# Patient Record
Sex: Female | Born: 1970 | Race: Black or African American | Hispanic: No | Smoking: Former smoker
Health system: Southern US, Community
[De-identification: ages and names within clinical notes are randomized; demographics above are authoritative.]

## PROBLEM LIST (undated history)

## (undated) DIAGNOSIS — R7303 Prediabetes: Secondary | ICD-10-CM

## (undated) DIAGNOSIS — F32A Depression, unspecified: Secondary | ICD-10-CM

## (undated) DIAGNOSIS — F419 Anxiety disorder, unspecified: Secondary | ICD-10-CM

## (undated) DIAGNOSIS — M1990 Unspecified osteoarthritis, unspecified site: Secondary | ICD-10-CM

## (undated) DIAGNOSIS — G709 Myoneural disorder, unspecified: Secondary | ICD-10-CM

## (undated) DIAGNOSIS — K219 Gastro-esophageal reflux disease without esophagitis: Secondary | ICD-10-CM

## (undated) DIAGNOSIS — R51 Headache: Secondary | ICD-10-CM

## (undated) DIAGNOSIS — R519 Headache, unspecified: Secondary | ICD-10-CM

## (undated) HISTORY — DX: Gastro-esophageal reflux disease without esophagitis: K21.9

## (undated) HISTORY — DX: Myoneural disorder, unspecified: G70.9

## (undated) HISTORY — DX: Headache: R51

## (undated) HISTORY — DX: Headache, unspecified: R51.9

## (undated) HISTORY — PX: SHOULDER ARTHROSCOPY: SHX128

## (undated) HISTORY — PX: LAPAROSCOPIC GASTRIC SLEEVE RESECTION: SHX5895

---

## 1994-01-24 DIAGNOSIS — F4310 Post-traumatic stress disorder, unspecified: Secondary | ICD-10-CM

## 1994-01-24 HISTORY — DX: Post-traumatic stress disorder, unspecified: F43.10

## 2000-01-25 HISTORY — PX: HEMICOLECTOMY: SHX854

## 2000-01-25 HISTORY — PX: COLON RESECTION: SHX5231

## 2014-10-07 ENCOUNTER — Emergency Department (HOSPITAL_COMMUNITY)
Admission: EM | Admit: 2014-10-07 | Discharge: 2014-10-07 | Disposition: A | Discharge status: Home / Self Care | Attending: Emergency Medicine | Admitting: Emergency Medicine

## 2014-10-07 ENCOUNTER — Emergency Department (HOSPITAL_COMMUNITY)

## 2014-10-07 ENCOUNTER — Encounter (HOSPITAL_COMMUNITY): Payer: Self-pay | Admitting: Emergency Medicine

## 2014-10-07 DIAGNOSIS — S99911A Unspecified injury of right ankle, initial encounter: Secondary | ICD-10-CM | POA: Diagnosis present

## 2014-10-07 DIAGNOSIS — M62838 Other muscle spasm: Secondary | ICD-10-CM

## 2014-10-07 DIAGNOSIS — Y9389 Activity, other specified: Secondary | ICD-10-CM | POA: Insufficient documentation

## 2014-10-07 DIAGNOSIS — Y92410 Unspecified street and highway as the place of occurrence of the external cause: Secondary | ICD-10-CM | POA: Diagnosis not present

## 2014-10-07 DIAGNOSIS — Y998 Other external cause status: Secondary | ICD-10-CM | POA: Diagnosis not present

## 2014-10-07 DIAGNOSIS — S93401A Sprain of unspecified ligament of right ankle, initial encounter: Secondary | ICD-10-CM | POA: Diagnosis not present

## 2014-10-07 MED ORDER — NAPROXEN 500 MG PO TABS: 500.0000 mg | ORAL_TABLET | Freq: Two times a day (BID) | ORAL | Status: DC | PRN

## 2014-10-07 MED ORDER — HYDROCODONE-ACETAMINOPHEN 5-325 MG PO TABS
1.0000 | ORAL_TABLET | Freq: Once | ORAL | Status: AC
  Administered 2014-10-07: 1 via ORAL
  Filled 2014-10-07: qty 1

## 2014-10-07 MED ORDER — HYDROCODONE-ACETAMINOPHEN 5-325 MG PO TABS: 1.0000 | ORAL_TABLET | Freq: Four times a day (QID) | ORAL | Status: DC | PRN

## 2014-10-07 MED ORDER — CYCLOBENZAPRINE HCL 10 MG PO TABS: 10.0000 mg | ORAL_TABLET | Freq: Three times a day (TID) | ORAL | Status: DC | PRN
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
## 2014-10-07 NOTE — ED Notes (Signed)
Patient called out to advise that she can't use crutches because she has an old rotator cuff injury.  Advised PA.

## 2014-10-07 NOTE — Discharge Instructions (Signed)
Take naprosyn as directed for inflammation and pain with norco for breakthrough pain and flexeril for muscle relaxation. Do not drive or operate machinery with pain medication or muscle relaxation use. Ice to areas of soreness for the next 24 hours and then may move to heat, no more than 20 minutes at a time every hour for each. Expect to be sore for the next few days and follow up with primary care physician for recheck of ongoing symptoms in the next 1-2 weeks. Return to ER for emergent changing or worsening of symptoms.    Wear ankle brace for at least 2 weeks for stabilization of ankle. Use crutches as needed for comfort. Ice and elevate ankle throughout the day. Alternate between naprosyn and norco for pain relief. Do not drive or operate machinery with pain medication use. Call orthopedic follow up today or tomorrow to schedule followup appointment for recheck of ongoing ankle pain that can be canceled with a 24-48 hour notice if complete resolution of pain. Return to the ER for changes or worsening symptoms.    Ankle Sprain An ankle sprain is an injury to the strong, fibrous tissues (ligaments) that hold your ankle bones together.  HOME CARE   Put ice on your ankle for 1-2 days or as told by your doctor.  Put ice in a plastic bag.  Place a towel between your skin and the bag.  Leave the ice on for 15-20 minutes at a time, every 2 hours while you are awake.  Only take medicine as told by your doctor.  Raise (elevate) your injured ankle above the level of your heart as much as possible for 2-3 days.  Use crutches if your doctor tells you to. Slowly put your own weight on the affected ankle. Use the crutches until you can walk without pain.  If you have a plaster splint:  Do not rest it on anything harder than a pillow for 24 hours.  Do not put weight on it.  Do not get it wet.  Take it off to shower or bathe.  If given, use an elastic wrap or support stocking for support. Take  the wrap off if your toes lose feeling (numb), tingle, or turn cold or blue.  If you have an air splint:  Add or let out air to make it comfortable.  Take it off at night and to shower and bathe.  Wiggle your toes and move your ankle up and down often while you are wearing it. GET HELP IF:  You have rapidly increasing bruising or puffiness (swelling).  Your toes feel very cold.  You lose feeling in your foot.  Your medicine does not help your pain. GET HELP RIGHT AWAY IF:   Your toes lose feeling (numb) or turn blue.  You have severe pain that is increasing. MAKE SURE YOU:   Understand these instructions.  Will watch your condition.  Will get help right away if you are not doing well or get worse. Document Released: 06/29/2007 Document Revised: 05/27/2013 Document Reviewed: 07/25/2011 [facility] Patient Information 2015 [place], [place]. This information is not intended to replace advice given to you by your health care provider. Make sure you discuss any questions you have with your health care provider.  Cryotherapy Cryotherapy is when you put ice on your injury. Ice helps lessen pain and puffiness (swelling) after an injury. Ice works the best when you start using it in the first 24 to 48 hours after an injury. HOME CARE  Put a dry or damp towel between the ice pack and your skin.  You may press gently on the ice pack.  Leave the ice on for no more than 10 to 20 minutes at a time.  Check your skin after 5 minutes to make sure your skin is okay.  Rest at least 20 minutes between ice pack uses.  Stop using ice when your skin loses feeling (numbness).  Do not use ice on someone who cannot tell you when it hurts. This includes small children and people with memory problems (dementia). GET HELP RIGHT AWAY IF:  You have white spots on your skin.  Your skin turns blue or pale.  Your skin feels waxy or hard.  Your puffiness gets worse. MAKE SURE YOU:   Understand  these instructions.  Will watch your condition.  Will get help right away if you are not doing well or get worse. Document Released: 06/29/2007 Document Revised: 04/04/2011 Document Reviewed: 09/02/2010 [facility] Patient Information 2015 [place], [place]. This information is not intended to replace advice given to you by your health care provider. Make sure you discuss any questions you have with your health care provider.  Motor Vehicle Collision It is common to have multiple bruises and sore muscles after a motor vehicle collision (MVC). These tend to feel worse for the first 24 hours. You may have the most stiffness and soreness over the first several hours. You may also feel worse when you wake up the first morning after your collision. After this point, you will usually begin to improve with each day. The speed of improvement often depends on the severity of the collision, the number of injuries, and the location and nature of these injuries. HOME CARE INSTRUCTIONS  Put ice on the injured area.  Put ice in a plastic bag.  Place a towel between your skin and the bag.  Leave the ice on for 15-20 minutes, 3-4 times a day, or as directed by your health care provider.  Drink enough fluids to keep your urine clear or pale yellow. Do not drink alcohol.  Take a warm shower or bath once or twice a day. This will increase blood flow to sore muscles.  You may return to activities as directed by your caregiver. Be careful when lifting, as this may aggravate neck or back pain.  Only take over-the-counter or prescription medicines for pain, discomfort, or fever as directed by your caregiver. Do not use aspirin. This may increase bruising and bleeding. SEEK IMMEDIATE MEDICAL CARE IF:  You have numbness, tingling, or weakness in the arms or legs.  You develop severe headaches not relieved with medicine.  You have severe neck pain, especially tenderness in the middle of the back of your  neck.  You have changes in bowel or bladder control.  There is increasing pain in any area of the body.  You have shortness of breath, light-headedness, dizziness, or fainting.  You have chest pain.  You feel sick to your stomach (nauseous), throw up (vomit), or sweat.  You have increasing abdominal discomfort.  There is blood in your urine, stool, or vomit.  You have pain in your shoulder (shoulder strap areas).  You feel your symptoms are getting worse. MAKE SURE YOU:  Understand these instructions.  Will watch your condition.  Will get help right away if you are not doing well or get worse. Document Released: 01/10/2005 Document Revised: 05/27/2013 Document Reviewed: 06/09/2010 [facility] Patient Information 2015 [place], [place]. This information is not  intended to replace advice given to you by your health care provider. Make sure you discuss any questions you have with your health care provider.  Muscle Cramps and Spasms Muscle cramps and spasms occur when a muscle or muscles tighten and you have no control over this tightening (involuntary muscle contraction). They are a common problem and can develop in any muscle. The most common place is in the calf muscles of the leg. Both muscle cramps and muscle spasms are involuntary muscle contractions, but they also have differences:   Muscle cramps are sporadic and painful. They may last a few seconds to a quarter of an hour. Muscle cramps are often more forceful and last longer than muscle spasms.  Muscle spasms may or may not be painful. They may also last just a few seconds or much longer. CAUSES  It is uncommon for cramps or spasms to be due to a serious underlying problem. In many cases, the cause of cramps or spasms is unknown. Some common causes are:   Overexertion.   Overuse from repetitive motions (doing the same thing over and over).   Remaining in a certain position for a long period of time.   Improper  preparation, form, or technique while performing a sport or activity.   Dehydration.   Injury.   Side effects of some medicines.   Abnormally low levels of the salts and ions in your blood (electrolytes), especially potassium and calcium. This could happen if you are taking water pills (diuretics) or you are pregnant.  Some underlying medical problems can make it more likely to develop cramps or spasms. These include, but are not limited to:   Diabetes.   Parkinson disease.   Hormone disorders, such as thyroid problems.   Alcohol abuse.   Diseases specific to muscles, joints, and bones.   Blood vessel disease where not enough blood is getting to the muscles.  HOME CARE INSTRUCTIONS   Stay well hydrated. Drink enough water and fluids to keep your urine clear or pale yellow.  It may be helpful to massage, stretch, and relax the affected muscle.  For tight or tense muscles, use a warm towel, heating pad, or hot shower water directed to the affected area.  If you are sore or have pain after a cramp or spasm, applying ice to the affected area may relieve discomfort.  Put ice in a plastic bag.  Place a towel between your skin and the bag.  Leave the ice on for 15-20 minutes, 03-04 times a day.  Medicines used to treat a known cause of cramps or spasms may help reduce their frequency or severity. Only take over-the-counter or prescription medicines as directed by your caregiver. SEEK MEDICAL CARE IF:  Your cramps or spasms get more severe, more frequent, or do not improve over time.  MAKE SURE YOU:   Understand these instructions.  Will watch your condition.  Will get help right away if you are not doing well or get worse. Document Released: 07/02/2001 Document Revised: 05/07/2012 Document Reviewed: 12/28/2011 [facility] Patient Information 2015 [place], [place]. This information is not intended to replace advice given to you by your health care provider. Make sure  you discuss any questions you have with your health care provider.  Heat Therapy Heat therapy can help make painful, stiff muscles and joints feel better. Do not use heat on new injuries. Wait at least 48 hours after an injury to use heat. Do not use heat when you have aches or pains  right after an activity. If you still have pain 3 hours after stopping the activity, then you may use heat. HOME CARE Wet heat pack  Soak a clean towel in warm water. Squeeze out the extra water.  Put the warm, wet towel in a plastic bag.  Place a thin, dry towel between your skin and the bag.  Put the heat pack on the area for 5 minutes, and check your skin. Your skin may be pink, but it should not be red.  Leave the heat pack on the area for 15 to 30 minutes.  Repeat this every 2 to 4 hours while awake. Do not use heat while you are sleeping. Warm water bath  Fill a tub with warm water.  Place the affected body part in the tub.  Soak the area for 20 to 40 minutes.  Repeat as needed. Hot water bottle  Fill the water bottle half full with hot water.  Press out the extra air. Close the cap tightly.  Place a dry towel between your skin and the bottle.  Put the bottle on the area for 5 minutes, and check your skin. Your skin may be pink, but it should not be red.  Leave the bottle on the area for 15 to 30 minutes.  Repeat this every 2 to 4 hours while awake. Electric heating pad  Place a dry towel between your skin and the heating pad.  Set the heating pad on low heat.  Put the heating pad on the area for 10 minutes, and check your skin. Your skin may be pink, but it should not be red.  Leave the heating pad on the area for 20 to 40 minutes.  Repeat this every 2 to 4 hours while awake.  Do not lie on the heating pad.  Do not fall asleep while using the heating pad.  Do not use the heating pad near water. GET HELP RIGHT AWAY IF:  You get blisters or red skin.  Your skin is puffy  (swollen), or you lose feeling (numbness) in the affected area.  You have any new problems.  Your problems are getting worse.  You have any questions or concerns. If you have any problems, stop using heat therapy until you see your doctor. MAKE SURE YOU:  Understand these instructions.  Will watch your condition.  Will get help right away if you are not doing well or get worse. Document Released: 04/04/2011 Document Reviewed: 03/05/2013 [facility] Patient Information 2015 [place], [place]. This information is not intended to replace advice given to you by your health care provider. Make sure you discuss any questions you have with your health care provider.
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
## 2014-10-14 ENCOUNTER — Emergency Department (HOSPITAL_COMMUNITY)
Admission: EM | Admit: 2014-10-14 | Discharge: 2014-10-14 | Disposition: A | Discharge status: Home / Self Care | Attending: Emergency Medicine | Admitting: Emergency Medicine

## 2014-10-14 ENCOUNTER — Encounter (HOSPITAL_COMMUNITY): Payer: Self-pay | Admitting: Emergency Medicine

## 2014-10-14 DIAGNOSIS — R51 Headache: Secondary | ICD-10-CM | POA: Insufficient documentation

## 2014-10-14 DIAGNOSIS — R519 Headache, unspecified: Secondary | ICD-10-CM

## 2014-10-14 MED ORDER — KETOROLAC TROMETHAMINE 30 MG/ML IJ SOLN
30.0000 mg | Freq: Once | INTRAMUSCULAR | Status: AC
  Administered 2014-10-14: 30 mg via INTRAMUSCULAR
  Filled 2014-10-14: qty 1

## 2014-10-14 MED ORDER — DIPHENHYDRAMINE HCL 25 MG PO CAPS
25.0000 mg | ORAL_CAPSULE | Freq: Once | ORAL | Status: AC
  Administered 2014-10-14: 25 mg via ORAL
  Filled 2014-10-14: qty 1

## 2014-10-14 MED ORDER — METOCLOPRAMIDE HCL 10 MG PO TABS
5.0000 mg | ORAL_TABLET | Freq: Once | ORAL | Status: AC
  Administered 2014-10-14: 5 mg via ORAL
  Filled 2014-10-14: qty 1

## 2014-10-14 NOTE — Discharge Instructions (Signed)
Please read/information. Please follow-up with [facility] wellness for further evaluation and management.

## 2014-10-14 NOTE — ED Notes (Signed)
Pt offered tylenol for pain and refused

## 2014-10-14 NOTE — ED Provider Notes (Signed)
CSN: [id number]     Arrival date & time 10/14/14  1113 History   First MD Initiated Contact with Patient 10/14/14 1341     Chief Complaint  Patient presents with  . Headache    HPI   44 year old female with a history of migraines presents today with a migraine. Patient reports last night slow onset of frontal throbbing headache, that is persisted through today. Patient reports the headache does not radiate, denies any changes and smells vision taste or any other neurological deficits; reports photophobia. Patient denies any fever, head trauma, neck stiffness. Patient reports this is identical to previous episodes, states that she recently moved to the area and has not found a primary care provider to prescribe her the sumatriptan she's used previously. Patient denies fever, chills, nausea vomiting, chest pain, abdominal pain. Patient has no other complaints in addition to her migraine.   History reviewed. No pertinent past medical history. History reviewed. No pertinent past surgical history. History reviewed. No pertinent family history. Social History  Substance Use Topics  . Smoking status: None  . Smokeless tobacco: None  . Alcohol Use: No   OB History    No data available     Review of Systems  All other systems reviewed and are negative.     Allergies  Review of patient's allergies indicates no known allergies.  Home Medications   Prior to Admission medications   Medication Sig Start Date End Date Taking? Authorizing Provider  cyclobenzaprine (FLEXERIL) 10 MG tablet Take 1 tablet (10 mg total) by mouth 3 (three) times daily as needed for muscle spasms. 10/07/14   Mercedes Camprubi-Soms, PA-C  HYDROcodone-acetaminophen (NORCO) 5-325 MG per tablet Take 1 tablet by mouth every 6 (six) hours as needed for severe pain. 10/07/14   Mercedes Camprubi-Soms, PA-C  naproxen (NAPROSYN) 500 MG tablet Take 1 tablet (500 mg total) by mouth 2 (two) times daily as needed for mild pain,  moderate pain or headache (TAKE WITH MEALS.). 10/07/14   Mercedes Camprubi-Soms, PA-C   BP 124/100 mmHg  Pulse 87  Temp(Src) 98.2 F (36.8 C) (Oral)  Resp 18  SpO2 97%  LMP 10/05/2014   Physical Exam  Constitutional: She is oriented to person, place, and time. She appears well-developed and well-nourished.  HENT:  Head: Normocephalic and atraumatic.  Eyes: Conjunctivae are normal. Pupils are equal, round, and reactive to light. Right eye exhibits no discharge. Left eye exhibits no discharge. No scleral icterus.  Neck: Normal range of motion. No JVD present. No tracheal deviation present.  Pulmonary/Chest: Effort normal. No stridor.  Neurological: She is alert and oriented to person, place, and time. She has normal strength. No cranial nerve deficit or sensory deficit. She displays a negative Romberg sign. Coordination normal. GCS eye subscore is 4. GCS verbal subscore is 5. GCS motor subscore is 6.  Psychiatric: She has a normal mood and affect. Her behavior is normal. Judgment and thought content normal.  Nursing note and vitals reviewed.   ED Course  Procedures (including critical care time) Labs Review Labs Reviewed - No data to display  Imaging Review No results found. I have personally reviewed and evaluated these images and lab results as part of my medical decision-making.   EKG Interpretation None      MDM   Final diagnoses:  Headache, unspecified headache type    Labs:  Imaging:  Consults:  Therapeutics: Benadryl, Reglan, Toradol  Discharge Meds:   Assessment/Plan: Patient's presentation most likely represents migraine headache. She  has no red flags, similar to previous. She was treated here in the ED with good symptom resolution, she is encouraged follow-up with [facility] and wellness for further evaluation and management. Patient given strict return precautions, verbalized understanding and agreement today's plan.         Eyvonne Mechanic,  PA-C 10/14/14 1508  Derwood Kaplan, MD 10/14/14 [phone]

## 2014-10-14 NOTE — ED Notes (Signed)
Pt sts right sided HA that started last night; pt denies visual change or nausea

## 2017-08-09 ENCOUNTER — Encounter: Payer: Self-pay | Admitting: Neurology

## 2017-10-17 NOTE — Progress Notes (Signed)
NEUROLOGY CONSULTATION NOTE  Meghan Orr MRN: [id number] DOB: [birth year]  Referring provider: Daisy FloroAntoinette Wymer, MD Primary care provider: Daisy FloroAntoinette Wymer, MD  Reason for consult:  migraines  HISTORY OF PRESENT ILLNESS: Meghan Orr is a 47 year old right-handed female who presents for headache.  History supplemented by referring provider's note.  Onset:  Early-mid 30s Location:  Top/crown of head Quality:  Starts with stabbing needles/sparks and then throbbing/pressure Intensity:  Severe.  She denies new headache, thunderclap headache or severe headache that wakes her from sleep. Aura:  Kaleidoscope vision Prodrome:  No Postdrome:  Hangover headache Associated symptoms:  Phonophobia, craves food, irritable.  She denies associated nausea, vomiting, photophobia, osmophobia, visual disturbance or unilateral numbness or weakness. Duration:  Sparks pain for 2 days, then headache gradually forms lasting 2 to 3 days, then hangover headache 2-3 days Frequency:  Every other month.  May be related to her menstrual cycle Triggers:  No Exacerbating factors:  Loud noise Relieving factors:  sleep Activity:  aggravates  Current NSAIDS:  Naproxen 500mg  (for shoulder pain) Current analgesics:  Norco (for shoulder pain) Current triptans:  Eletriptan 40mg  (feels like she has an "out of body" experience. She takes it at earliest onset of the pressure/throbbing headache) Current ergotamine:  no Current anti-emetic:  no Current muscle relaxants:  Flexeril 10mg  Current anti-anxiolytic:  no Current sleep aide:  trazodone Current Antihypertensive medications:  no Current Antidepressant medications:  Sertraline 100mg  daily Current Anticonvulsant medications:  topiramate 200mg  every night Current anti-CGRP:  no Current Vitamins/Herbal/Supplements:  no Current Antihistamines/Decongestants:  no Other therapy:  no Hormone/birth control:  Mirena  Past NSAIDS:  Ibuprofen, naproxen Past  analgesics:  Fioricet, Excedrin, Tylenol Past abortive triptans:  Maxalt Past abortive ergotamine:  no Past muscle relaxants:  no Past anti-emetic:  no Past antihypertensive medications:  no Past antidepressant medications:  Sertraline, Wellbutrin Past anticonvulsant medications:  gabapentin Past anti-CGRP:  no Past vitamins/Herbal/Supplements:  no Past antihistamines/decongestants:  no Other past therapies:  none  Caffeine:  16 oz coffee daily, Cherry Coke Zero 3 times week Alcohol:  yes Smoker:  no Diet:  Drinks 66 oz water Exercise:  walks Depression:  yes; Anxiety:  Yes.  PTSD Other pain:  Shoulder pain Sleep hygiene:  4 to 6 hours a night  08/01/17 LABS:  Na 138, K 3.8, CO2 26, glucose 104, Cr 0.748, t bili 0.3, ALT 24, AST 18. Family history of headache:  No  PAST MEDICAL HISTORY: PTSD  PAST SURGICAL HISTORY: Right shoulder surgery  MEDICATIONS: Current Outpatient Medications on File Prior to Visit  Medication Sig Dispense Refill  . cyclobenzaprine (FLEXERIL) 10 MG tablet Take 1 tablet (10 mg total) by mouth 3 (three) times daily as needed for muscle spasms. 15 tablet 0  . HYDROcodone-acetaminophen (NORCO) 5-325 MG per tablet Take 1 tablet by mouth every 6 (six) hours as needed for severe pain. 6 tablet 0  . naproxen (NAPROSYN) 500 MG tablet Take 1 tablet (500 mg total) by mouth 2 (two) times daily as needed for mild pain, moderate pain or headache (TAKE WITH MEALS.). 20 tablet 0   No current facility-administered medications on file prior to visit.     ALLERGIES: No Known Allergies  FAMILY HISTORY: No family history on file.  SOCIAL HISTORY: Social History   Socioeconomic History  . Marital status: Divorced    Spouse name: Not on file  . Number of children: Not on file  . Years of education: Not on file  . Highest  education level: Not on file  Occupational History  . Not on file  Social Needs  . Financial resource strain: Not on file  . Food  insecurity:    Worry: Not on file    Inability: Not on file  . Transportation needs:    Medical: Not on file    Non-medical: Not on file  Tobacco Use  . Smoking status: Not on file  Substance and Sexual Activity  . Alcohol use: No  . Drug use: No  . Sexual activity: Not on file  Lifestyle  . Physical activity:    Days per week: Not on file    Minutes per session: Not on file  . Stress: Not on file  Relationships  . Social connections:    Talks on phone: Not on file    Gets together: Not on file    Attends religious service: Not on file    Active member of club or organization: Not on file    Attends meetings of clubs or organizations: Not on file    Relationship status: Not on file  . Intimate partner violence:    Fear of current or ex partner: Not on file    Emotionally abused: Not on file    Physically abused: Not on file    Forced sexual activity: Not on file  Other Topics Concern  . Not on file  Social History Narrative  . Not on file    REVIEW OF SYSTEMS: Constitutional: No fevers, chills, or sweats, no generalized fatigue, change in appetite Eyes: No visual changes, double vision, eye pain Ear, nose and throat: No hearing loss, ear pain, nasal congestion, sore throat Cardiovascular: No chest pain, palpitations Respiratory:  No shortness of breath at rest or with exertion, wheezes GastrointestinaI: No nausea, vomiting, diarrhea, abdominal pain, fecal incontinence Genitourinary:  No dysuria, urinary retention or frequency Musculoskeletal:  No neck pain, back pain Integumentary: No rash, pruritus, skin lesions Neurological: as above Psychiatric: No depression, insomnia, anxiety Endocrine: No palpitations, fatigue, diaphoresis, mood swings, change in appetite, change in weight, increased thirst Hematologic/Lymphatic:  No purpura, petechiae. Allergic/Immunologic: no itchy/runny eyes, nasal congestion, recent allergic reactions, rashes  PHYSICAL EXAM: Blood  pressure 130/86, pulse 72, height 5\' 8"  (1.727 m), weight (!) 330 lb (149.7 kg), SpO2 99 %. General: No acute distress.  Patient appears well-groomed.  Head:  Normocephalic/atraumatic Eyes:  fundi examined but not visualized Neck: supple, no paraspinal tenderness, full range of motion Back: No paraspinal tenderness Heart: regular rate and rhythm Lungs: Clear to auscultation bilaterally. Vascular: No carotid bruits. Neurological Exam: Mental status: alert and oriented to person, place, and time, recent and remote memory intact, fund of knowledge intact, attention and concentration intact, speech fluent and not dysarthric, language intact. Cranial nerves: CN I: not tested CN II: pupils equal, round and reactive to light, visual fields intact CN III, IV, VI:  full range of motion, no nystagmus, no ptosis CN V: facial sensation intact CN VII: upper and lower face symmetric CN VIII: hearing intact CN IX, X: gag intact, uvula midline CN XI: sternocleidomastoid and trapezius muscles intact CN XII: tongue midline Bulk & Tone: normal, no fasciculations. Motor:  5/5 throughout  Sensation: temperature and vibration sensation intact. Deep Tendon Reflexes:  2+ throughout, toes downgoing.  Finger to nose testing:  Without dysmetria.  Heel to shin:  Without dysmetria.  Gait:  Normal station and stride.  Able to turn and tandem walk. Romberg negative.  IMPRESSION: Migraine with aura, without status  migrainosus, intractable Morbid obesity (BMI 50.18)  PLAN: 1.  Will start Aimovig 70mg  monthly in addition to topiramate 200mg  at night. 2.  For abortive therapy, will try Zomig 5mg  NS 3.  Limit use of pain relievers to no more than 2 days out of week to prevent risk of rebound or medication-overuse headache. 4.  Keep headache diary 5.  Exercise, hydration, sleep hygiene, weight loss 6.  Consider magnesium citrate 400mg  daily, riboflavin 400mg  daily and CoQ10 100mg  three times daily 7.  Follow up  in 3 to 4 months.  Thank you for allowing me to take part in the care of this patient.  Shon Millet, DO  CC:  Daisy Floro, MD

## 2017-10-18 ENCOUNTER — Encounter

## 2017-10-18 ENCOUNTER — Ambulatory Visit (INDEPENDENT_AMBULATORY_CARE_PROVIDER_SITE_OTHER): Payer: No Typology Code available for payment source | Admitting: Neurology

## 2017-10-18 ENCOUNTER — Encounter: Payer: Self-pay | Admitting: Neurology

## 2017-10-18 VITALS — BP 130/86 | HR 72 | Ht 68.0 in | Wt 330.0 lb

## 2017-10-18 DIAGNOSIS — G43111 Migraine with aura, intractable, with status migrainosus: Secondary | ICD-10-CM

## 2017-10-18 MED ORDER — ERENUMAB-AOOE 70 MG/ML ~~LOC~~ SOAJ: 70.0000 mg | SUBCUTANEOUS | 11 refills | Status: DC

## 2017-10-18 MED ORDER — ZOLMITRIPTAN 5 MG NA SOLN: 5.0000 mg | NASAL | 0 refills | Status: DC | PRN

## 2017-10-18 MED ORDER — ERENUMAB-AOOE 70 MG/ML ~~LOC~~ SOAJ: 70.0000 mg | SUBCUTANEOUS | 0 refills | Status: DC

## 2017-10-18 NOTE — Patient Instructions (Signed)
Migraine Recommendations: 1.  Start Aimovig 70mg  monthly injection.   2.  Take Zomig 5mg  nasal spray, 1 spray in one nostril at earliest onset of headache.  May repeat dose once in 2 hours if needed.  Do not exceed two doses in 24 hours. 3.  Limit use of pain relievers to no more than 2 days out of the week.  These medications include acetaminophen, ibuprofen, triptans and narcotics.  This will help reduce risk of rebound headaches. 4.  Be aware of common food triggers such as processed sweets, processed foods with nitrites (such as deli meat, hot dogs, sausages), foods with MSG, alcohol (such as wine), chocolate, certain cheeses, certain fruits (dried fruits, bananas, some citrus fruit), vinegar, diet soda. 4.  Avoid caffeine 5.  Routine exercise 6.  Proper sleep hygiene 7.  Stay adequately hydrated with water 8.  Keep a headache diary. 9.  Maintain proper stress management. 10.  Do not skip meals. 11.  Consider supplements:  Magnesium citrate 400mg  to 600mg  daily, riboflavin 400mg , Coenzyme Q 10 100mg  three times daily

## 2017-12-26 ENCOUNTER — Telehealth: Payer: Self-pay | Admitting: Neurology

## 2017-12-26 NOTE — Telephone Encounter (Signed)
[facility] [place] [facility] copy of the office visit notes from her September appt. Please fax this info to [phone]. Thanks!

## 2017-12-26 NOTE — Telephone Encounter (Signed)
Faxing notes.  

## 2018-02-20 ENCOUNTER — Telehealth: Payer: Self-pay | Admitting: Neurology

## 2018-02-20 NOTE — Progress Notes (Deleted)
NEUROLOGY FOLLOW UP OFFICE NOTE  Meghan Orr [id number]  HISTORY OF PRESENT ILLNESS: Meghan Orr is a 48 year old right-handed female who follows up for migraine.  UPDATE: Intensity:  *** Duration:  *** Frequency:  *** Frequency of abortive medication: *** Current NSAIDS:  Naproxen 500mg  (for shoulder pain) Current analgesics:   Zomig 5 mg nasal spray headache) Current ergotamine:  no Current anti-emetic:  no Current muscle relaxants:  Flexeril 10mg  Current anti-anxiolytic:  no Current sleep aide:  trazodone Current Antihypertensive medications:  no Current Antidepressant medications:  Sertraline 100mg  daily Current Anticonvulsant medications:  topiramate 200mg  every night Current anti-CGRP:   Aimovig 70 mg Current Vitamins/Herbal/Supplements:  no Current Antihistamines/Decongestants:  no Other therapy:  no Hormone/birth control:  Mirena  Caffeine:  16 oz coffee daily, Cherry Coke Zero 3 times week Alcohol:  yes Smoker:  no Diet:  Drinks 66 oz water Exercise:  walks Depression:  yes; Anxiety:  Yes.  PTSD Other pain:  Shoulder pain Sleep hygiene:  4 to 6 hours a night  HISTORY: Onset: Early to mid 30s Location:  Top/crown of head Quality:  Starts with stabbing needles/sparks and then throbbing/pressure Initial intensity:  Severe.  She denies new headache, thunderclap headache or severe headache that wakes her from sleep. Aura:  Kaleidoscope vision Prodrome:  No Postdrome:  Hangover headache Associated symptoms: Photophobia, craves food, irritable.  She denies associated nausea, vomiting, photophobia, osmophobia, visual disturbance or unilateral numbness or weakness. Initial duration:  Sparks pain for 2 days, then headache gradually forms lasting 2 to 3 days, then hangover headache 2-3 days Initial Frequency:  Every other month.  May be related to her menstrual cycle Triggers: None Exacerbating factors:  Loud noise Relieving factors:  sleep Activity:   aggravates  Past NSAIDS:  Ibuprofen, naproxen Past analgesics:  Fioricet, Excedrin, Tylenol Past abortive triptans:  Maxalt, Eletriptan (adverse reaction "out of body" experience) Past abortive ergotamine:  no Past muscle relaxants:  no Past anti-emetic:  no Past antihypertensive medications:  no Past antidepressant medications:  Sertraline, Wellbutrin Past anticonvulsant medications:  gabapentin Past anti-CGRP:  no Past vitamins/Herbal/Supplements:  no Past antihistamines/decongestants:  no Other past therapies:  none  Sleep hygiene:  4 to 6 hours a night  PAST MEDICAL HISTORY: Past Medical History:  Diagnosis Date  . Headache     MEDICATIONS: Current Outpatient Medications on File Prior to Visit  Medication Sig Dispense Refill  . cyclobenzaprine (FLEXERIL) 10 MG tablet Take 1 tablet (10 mg total) by mouth 3 (three) times daily as needed for muscle spasms. 15 tablet 0  . Erenumab-aooe (AIMOVIG) 70 MG/ML SOAJ Inject 70 mg into the skin every 30 (thirty) days. 1 pen 11  . Erenumab-aooe (AIMOVIG) 70 MG/ML SOAJ Inject 70 mg into the skin every 30 (thirty) days. 1 pen 0  . HYDROcodone-acetaminophen (NORCO) 5-325 MG per tablet Take 1 tablet by mouth every 6 (six) hours as needed for severe pain. 6 tablet 0  . naproxen (NAPROSYN) 500 MG tablet Take 1 tablet (500 mg total) by mouth 2 (two) times daily as needed for mild pain, moderate pain or headache (TAKE WITH MEALS.). 20 tablet 0  . zolmitriptan (ZOMIG) 5 MG nasal solution Place 1 spray into the nose as needed for migraine. 6 Units 0   No current facility-administered medications on file prior to visit.     ALLERGIES: Allergies  Allergen Reactions  . Eggs Or Egg-Derived Products   . Morphine     FAMILY HISTORY: Family History  Problem Relation Age of Onset  . Diabetes Father   . Heart attack Father   . Meniere's disease Sister    SOCIAL HISTORY: Social History   Socioeconomic History  . Marital status: Divorced     Spouse name: Not on file  . Number of children: 2  . Years of education: Not on file  . Highest education level: Not on file  Occupational History  . Occupation: Chartered certified accountantoptomitrist tech  Social Needs  . Financial resource strain: Not on file  . Food insecurity:    Worry: Not on file    Inability: Not on file  . Transportation needs:    Medical: Not on file    Non-medical: Not on file  Tobacco Use  . Smoking status: Former Smoker    Types: Cigarettes    Last attempt to quit: 01/25/2015    Years since quitting: 3.0  . Smokeless tobacco: Never Used  Substance and Sexual Activity  . Alcohol use: Yes    Comment: 2-3 cocktails a week  . Drug use: No  . Sexual activity: Not on file  Lifestyle  . Physical activity:    Days per week: Not on file    Minutes per session: Not on file  . Stress: Not on file  Relationships  . Social connections:    Talks on phone: Not on file    Gets together: Not on file    Attends religious service: Not on file    Active member of club or organization: Not on file    Attends meetings of clubs or organizations: Not on file    Relationship status: Not on file  . Intimate partner violence:    Fear of current or ex partner: Not on file    Emotionally abused: Not on file    Physically abused: Not on file    Forced sexual activity: Not on file  Other Topics Concern  . Not on file  Social History Narrative   Patient is right-handed. She is divorced and lives in a 2 story house with her son. She drinks 2 cups of coffee a day and a soda QOD. She is active at work.    REVIEW OF SYSTEMS: Constitutional: No fevers, chills, or sweats, no generalized fatigue, change in appetite Eyes: No visual changes, double vision, eye pain Ear, nose and throat: No hearing loss, ear pain, nasal congestion, sore throat Cardiovascular: No chest pain, palpitations Respiratory:  No shortness of breath at rest or with exertion, wheezes GastrointestinaI: No nausea, vomiting,  diarrhea, abdominal pain, fecal incontinence Genitourinary:  No dysuria, urinary retention or frequency Musculoskeletal:  No neck pain, back pain Integumentary: No rash, pruritus, skin lesions Neurological: as above Psychiatric: No depression, insomnia, anxiety Endocrine: No palpitations, fatigue, diaphoresis, mood swings, change in appetite, change in weight, increased thirst Hematologic/Lymphatic:  No purpura, petechiae. Allergic/Immunologic: no itchy/runny eyes, nasal congestion, recent allergic reactions, rashes  PHYSICAL EXAM: *** General: No acute distress.  Patient appears ***-groomed.  *** body habitus. Head:  Normocephalic/atraumatic Eyes:  Fundi examined but not visualized Neck: supple, no paraspinal tenderness, full range of motion Heart:  Regular rate and rhythm Lungs:  Clear to auscultation bilaterally Back: No paraspinal tenderness Neurological Exam: alert and oriented to person, place, and time. Attention span and concentration intact, recent and remote memory intact, fund of knowledge intact.  Speech fluent and not dysarthric, language intact.  CN II-XII intact. Bulk and tone normal, muscle strength 5/5 throughout.  Sensation to light touch  intact.  Deep tendon reflexes 2+ throughout.  Finger to nose testing intact.  Gait normal, Romberg negative.  IMPRESSION: Migraine without aura, without status migrainosus, not intractable  PLAN: 1.  For preventative management, *** 2.  For abortive therapy, *** 3.  Limit use of pain relievers to no more than 2 days out of week to prevent risk of rebound or medication-overuse headache. 4.  Keep headache diary 5.  Exercise, hydration, caffeine cessation, sleep hygiene, monitor for and avoid triggers 6.  Consider:  magnesium citrate 400mg  daily, riboflavin 400mg  daily, and coenzyme Q10 100mg  three times daily 7.  Follow up ***  Shon Millet, DO  CC: Daisy Floro, MD

## 2018-02-20 NOTE — Telephone Encounter (Signed)
[facility] Aimovig. She is also needing records?. Please call her at [phone]. Thanks!

## 2018-02-21 ENCOUNTER — Ambulatory Visit: Payer: Non-veteran care | Admitting: Neurology

## 2018-03-07 NOTE — Progress Notes (Deleted)
NEUROLOGY FOLLOW UP OFFICE NOTE  Meghan Orr [id number]  HISTORY OF PRESENT ILLNESS: Meghan Orr is a 48 year old right-handed female who follows up for migraine.  UPDATE: Intensity:  *** Duration:  *** Frequency:  *** Frequency of abortive medication: *** Current NSAIDS:  Naproxen 500mg  (for shoulder pain) Current analgesics:   Zomig 5 mg nasal spray  Current ergotamine:  no Current anti-emetic:  no Current muscle relaxants:  Flexeril 10mg  Current anti-anxiolytic:  no Current sleep aide:  trazodone Current Antihypertensive medications:  no Current Antidepressant medications:  Sertraline 100mg  daily Current Anticonvulsant medications:  topiramate 200mg  every night Current anti-CGRP:   Aimovig 70 mg Current Vitamins/Herbal/Supplements:  no Current Antihistamines/Decongestants:  no Other therapy:  no Hormone/birth control:  Mirena  Caffeine:  16 oz coffee daily, Cherry Coke Zero 3 times week Alcohol:  yes Smoker:  no Diet:  Drinks 66 oz water Exercise:  walks Depression:  yes; Anxiety:  Yes.  PTSD Other pain:  Shoulder pain Sleep hygiene:  4 to 6 hours a night  HISTORY: Onset: Early to mid 30s Location:  Top/crown of head Quality:  Starts with stabbing needles/sparks and then throbbing/pressure Initial intensity:  Severe.  She denies new headache, thunderclap headache or severe headache that wakes her from sleep. Aura:  Kaleidoscope vision Prodrome:  No Postdrome:  Hangover headache Associated symptoms: Photophobia, craves food, irritable.  She denies associated nausea, vomiting, photophobia, osmophobia, visual disturbance or unilateral numbness or weakness. Initial duration:  Sparks pain for 2 days, then headache gradually forms lasting 2 to 3 days, then hangover headache 2-3 days Initial Frequency:  Every other month.  May be related to her menstrual cycle Triggers: None Exacerbating factors:  Loud noise Relieving factors:  sleep Activity:   aggravates  Past NSAIDS:  Ibuprofen, naproxen Past analgesics:  Fioricet, Excedrin, Tylenol Past abortive triptans:  Maxalt, Eletriptan (adverse reaction "out of body" experience) Past abortive ergotamine:  no Past muscle relaxants:  no Past anti-emetic:  no Past antihypertensive medications:  no Past antidepressant medications:  Sertraline, Wellbutrin Past anticonvulsant medications:  gabapentin Past anti-CGRP:  no Past vitamins/Herbal/Supplements:  no Past antihistamines/decongestants:  no Other past therapies:  none  Sleep hygiene:  4 to 6 hours a night  PAST MEDICAL HISTORY: Past Medical History:  Diagnosis Date  . Headache     MEDICATIONS: Current Outpatient Medications on File Prior to Visit  Medication Sig Dispense Refill  . cyclobenzaprine (FLEXERIL) 10 MG tablet Take 1 tablet (10 mg total) by mouth 3 (three) times daily as needed for muscle spasms. 15 tablet 0  . Erenumab-aooe (AIMOVIG) 70 MG/ML SOAJ Inject 70 mg into the skin every 30 (thirty) days. 1 pen 11  . Erenumab-aooe (AIMOVIG) 70 MG/ML SOAJ Inject 70 mg into the skin every 30 (thirty) days. 1 pen 0  . HYDROcodone-acetaminophen (NORCO) 5-325 MG per tablet Take 1 tablet by mouth every 6 (six) hours as needed for severe pain. 6 tablet 0  . naproxen (NAPROSYN) 500 MG tablet Take 1 tablet (500 mg total) by mouth 2 (two) times daily as needed for mild pain, moderate pain or headache (TAKE WITH MEALS.). 20 tablet 0  . zolmitriptan (ZOMIG) 5 MG nasal solution Place 1 spray into the nose as needed for migraine. 6 Units 0   No current facility-administered medications on file prior to visit.     ALLERGIES: Allergies  Allergen Reactions  . Eggs Or Egg-Derived Products   . Morphine     FAMILY HISTORY: Family History  Problem Relation Age of Onset  . Diabetes Father   . Heart attack Father   . Meniere's disease Sister    SOCIAL HISTORY: Social History   Socioeconomic History  . Marital status: Divorced     Spouse name: Not on file  . Number of children: 2  . Years of education: Not on file  . Highest education level: Not on file  Occupational History  . Occupation: Chartered certified accountant  Social Needs  . Financial resource strain: Not on file  . Food insecurity:    Worry: Not on file    Inability: Not on file  . Transportation needs:    Medical: Not on file    Non-medical: Not on file  Tobacco Use  . Smoking status: Former Smoker    Types: Cigarettes    Last attempt to quit: 01/25/2015    Years since quitting: 3.1  . Smokeless tobacco: Never Used  Substance and Sexual Activity  . Alcohol use: Yes    Comment: 2-3 cocktails a week  . Drug use: No  . Sexual activity: Not on file  Lifestyle  . Physical activity:    Days per week: Not on file    Minutes per session: Not on file  . Stress: Not on file  Relationships  . Social connections:    Talks on phone: Not on file    Gets together: Not on file    Attends religious service: Not on file    Active member of club or organization: Not on file    Attends meetings of clubs or organizations: Not on file    Relationship status: Not on file  . Intimate partner violence:    Fear of current or ex partner: Not on file    Emotionally abused: Not on file    Physically abused: Not on file    Forced sexual activity: Not on file  Other Topics Concern  . Not on file  Social History Narrative   Patient is right-handed. She is divorced and lives in a 2 story house with her son. She drinks 2 cups of coffee a day and a soda QOD. She is active at work.    REVIEW OF SYSTEMS: Constitutional: No fevers, chills, or sweats, no generalized fatigue, change in appetite Eyes: No visual changes, double vision, eye pain Ear, nose and throat: No hearing loss, ear pain, nasal congestion, sore throat Cardiovascular: No chest pain, palpitations Respiratory:  No shortness of breath at rest or with exertion, wheezes GastrointestinaI: No nausea, vomiting,  diarrhea, abdominal pain, fecal incontinence Genitourinary:  No dysuria, urinary retention or frequency Musculoskeletal:  No neck pain, back pain Integumentary: No rash, pruritus, skin lesions Neurological: as above Psychiatric: No depression, insomnia, anxiety Endocrine: No palpitations, fatigue, diaphoresis, mood swings, change in appetite, change in weight, increased thirst Hematologic/Lymphatic:  No purpura, petechiae. Allergic/Immunologic: no itchy/runny eyes, nasal congestion, recent allergic reactions, rashes  PHYSICAL EXAM: *** General: No acute distress.  Patient appears ***-groomed.  *** body habitus. Head:  Normocephalic/atraumatic Eyes:  Fundi examined but not visualized Neck: supple, no paraspinal tenderness, full range of motion Heart:  Regular rate and rhythm Lungs:  Clear to auscultation bilaterally Back: No paraspinal tenderness Neurological Exam: alert and oriented to person, place, and time. Attention span and concentration intact, recent and remote memory intact, fund of knowledge intact.  Speech fluent and not dysarthric, language intact.  CN II-XII intact. Bulk and tone normal, muscle strength 5/5 throughout.  Sensation to light touch  intact.  Deep tendon reflexes 2+ throughout.  Finger to nose testing intact.  Gait normal, Romberg negative.  IMPRESSION: Migraine without aura, without status migrainosus, not intractable  PLAN: 1.  For preventative management, *** 2.  For abortive therapy, *** 3.  Limit use of pain relievers to no more than 2 days out of week to prevent risk of rebound or medication-overuse headache. 4.  Keep headache diary 5.  Exercise, hydration, caffeine cessation, sleep hygiene, monitor for and avoid triggers 6.  Consider:  magnesium citrate 400mg  daily, riboflavin 400mg  daily, and coenzyme Q10 100mg  three times daily 7.  Follow up ***  Shon Millet, DO  CC: Daisy Floro, MD

## 2018-03-09 ENCOUNTER — Ambulatory Visit: Payer: Non-veteran care | Admitting: Neurology

## 2018-12-10 ENCOUNTER — Other Ambulatory Visit: Payer: Self-pay

## 2018-12-10 ENCOUNTER — Ambulatory Visit (HOSPITAL_COMMUNITY)
Admission: EM | Admit: 2018-12-10 | Discharge: 2018-12-10 | Disposition: A | Discharge status: Home / Self Care | Attending: Internal Medicine | Admitting: Internal Medicine

## 2018-12-10 ENCOUNTER — Encounter (HOSPITAL_COMMUNITY): Payer: Self-pay | Admitting: Emergency Medicine

## 2018-12-10 ENCOUNTER — Ambulatory Visit (HOSPITAL_COMMUNITY): Admission: EM | Admit: 2018-12-10 | Discharge: 2018-12-10 | Discharge status: Absent without leave | Payer: Self-pay

## 2018-12-10 DIAGNOSIS — G43009 Migraine without aura, not intractable, without status migrainosus: Secondary | ICD-10-CM

## 2018-12-10 MED ORDER — METOCLOPRAMIDE HCL 5 MG/ML IJ SOLN
10.0000 mg | Freq: Once | INTRAMUSCULAR | Status: AC
  Administered 2018-12-10: 10 mg via INTRAMUSCULAR

## 2018-12-10 MED ORDER — ONDANSETRON 4 MG PO TBDP: 4.0000 mg | ORAL_TABLET | Freq: Three times a day (TID) | ORAL | 0 refills | Status: DC | PRN

## 2018-12-10 MED ORDER — DEXAMETHASONE SODIUM PHOSPHATE 10 MG/ML IJ SOLN
10.0000 mg | Freq: Once | INTRAMUSCULAR | Status: AC
  Administered 2018-12-10: 10 mg via INTRAMUSCULAR

## 2018-12-10 MED ORDER — DEXAMETHASONE SODIUM PHOSPHATE 10 MG/ML IJ SOLN
INTRAMUSCULAR | Status: AC
  Filled 2018-12-10: qty 1

## 2018-12-10 MED ORDER — SUMATRIPTAN SUCCINATE 6 MG/0.5ML ~~LOC~~ SOLN
SUBCUTANEOUS | Status: AC
  Filled 2018-12-10: qty 0.5

## 2018-12-10 MED ORDER — METOCLOPRAMIDE HCL 5 MG/ML IJ SOLN
INTRAMUSCULAR | Status: AC
Start: 2018-12-10 — End: ?
  Filled 2018-12-10: qty 2

## 2018-12-10 MED ORDER — SUMATRIPTAN SUCCINATE 25 MG PO TABS: ORAL_TABLET | ORAL | 0 refills | Status: DC

## 2018-12-10 MED ORDER — SUMATRIPTAN SUCCINATE 6 MG/0.5ML ~~LOC~~ SOLN
6.0000 mg | Freq: Once | SUBCUTANEOUS | Status: AC
  Administered 2018-12-10: 6 mg via SUBCUTANEOUS

## 2018-12-10 NOTE — ED Triage Notes (Signed)
Complains of headache that started today.  Patient has taken excedrin x 2 .  Patient's eyes are swollen.  Patient has also had tylenol .  Describes headache as throbbing.  Pain is right forehead/temp area, behind right eye and a cramp going sown right side of neck

## 2018-12-10 NOTE — ED Provider Notes (Signed)
[facility]    CSN: [id number] Arrival date & time: 12/10/18  1902      History   Chief Complaint Chief Complaint  Patient presents with  . Headache    HPI Meghan Orr is a 48 y.o. female with a history of migraine comes to urgent care with complaints of a 1 day history of right retro-orbital pain.  Onset was fairly sudden.  Pain is currently throbbing and constant.  Is aggravated by bright light.  No known relieving factors.  Patient took Excedrin twice with no relief.  Patient has had some nausea but no vomiting.  No fever or chills.  No cough or sputum production.  No sick contacts.   HPI  Past Medical History:  Diagnosis Date  . Headache     There are no active problems to display for this patient.   Past Surgical History:  Procedure Laterality Date  . SHOULDER ARTHROSCOPY Right     OB History   No obstetric history on file.      Home Medications    Prior to Admission medications   Medication Sig Start Date End Date Taking? Authorizing Provider  acetaminophen (TYLENOL) 325 MG tablet Take 650 mg by mouth every 6 (six) hours as needed.   Yes [provider]  aspirin-acetaminophen-caffeine (EXCEDRIN MIGRAINE) [phone] MG tablet Take by mouth every 6 (six) hours as needed for headache.   Yes [provider]  naproxen (NAPROSYN) 500 MG tablet Take 1 tablet (500 mg total) by mouth 2 (two) times daily as needed for mild pain, moderate pain or headache (TAKE WITH MEALS.). 10/07/14   Street, Mercedes, PA-C  ondansetron (ZOFRAN ODT) 4 MG disintegrating tablet Take 1 tablet (4 mg total) by mouth every 8 (eight) hours as needed for nausea or vomiting. 12/10/18   Marjorie Lussier, Britta Mccreedy, MD  SUMAtriptan (IMITREX) 25 MG tablet Please take 1 tablet at the onset of head.May repeat in 2 hours if headache persists or recurs. 12/10/18   LampteyBritta Mccreedy, MD  zolmitriptan (ZOMIG) 5 MG nasal solution Place 1 spray into the nose as needed for migraine.  10/18/17   Drema Dallas, DO    Family History Family History  Problem Relation Age of Onset  . Diabetes Father   . Heart attack Father   . Meniere's disease Sister     Social History Social History   Tobacco Use  . Smoking status: Former Smoker    Types: Cigarettes    Quit date: 01/25/2015    Years since quitting: 3.8  . Smokeless tobacco: Never Used  Substance Use Topics  . Alcohol use: Yes    Comment: 2-3 cocktails a week  . Drug use: No     Allergies   Eggs or egg-derived products and Morphine   Review of Systems Review of Systems  Constitutional: Positive for activity change. Negative for chills, fatigue and unexpected weight change.  HENT: Negative for congestion, ear discharge, ear pain, facial swelling, postnasal drip, sneezing and sore throat.   Eyes: Positive for pain and redness. Negative for discharge and itching.  Respiratory: Negative for cough and shortness of breath.   Cardiovascular: Negative for chest pain and palpitations.  Gastrointestinal: Positive for nausea. Negative for diarrhea and vomiting.  Genitourinary: Negative.   Musculoskeletal: Negative.   Skin: Negative for pallor and wound.  Neurological: Positive for light-headedness and headaches. Negative for dizziness, tremors, weakness and numbness.  Psychiatric/Behavioral: Negative for confusion and decreased concentration.     Physical  Exam Triage Vital Signs ED Triage Vitals  Enc Vitals Group     BP 12/10/18 1944 (!) 147/90     Pulse Rate 12/10/18 1944 72     Resp 12/10/18 1944 (!) 24     Temp 12/10/18 1944 98.4 F (36.9 C)     Temp Source 12/10/18 1944 Oral     SpO2 12/10/18 1944 100 %     Weight --      Height --      Head Circumference --      Peak Flow --      Pain Score 12/10/18 1940 9     Pain Loc --      Pain Edu? --      Excl. in [place]? --    No data found.  Updated Vital Signs BP (!) 147/90 (BP Location: Right Arm) Comment (BP Location): large cuff, right forearm   Pulse 72   Temp 98.4 F (36.9 C) (Oral)   Resp (!) 24   SpO2 100%   Visual Acuity Right Eye Distance:   Left Eye Distance:   Bilateral Distance:    Right Eye Near:   Left Eye Near:    Bilateral Near:     Physical Exam Vitals signs and nursing note reviewed.  Constitutional:      General: She is in acute distress.     Appearance: She is ill-appearing.  HENT:     Mouth/Throat:     Mouth: Mucous membranes are moist.     Pharynx: Oropharynx is clear.  Eyes:     General: No scleral icterus.    Extraocular Movements: Extraocular movements intact.     Right eye: Normal extraocular motion.     Left eye: Normal extraocular motion.     Pupils: Pupils are equal, round, and reactive to light. Pupils are equal.     Comments: Mild periorbital fullness bilaterally.  Mild conjunctival erythema with no discharge.  Neck:     Musculoskeletal: Normal range of motion. No neck rigidity.     Meningeal: Kernig's sign absent.  Cardiovascular:     Rate and Rhythm: Normal rate and regular rhythm.  Pulmonary:     Effort: Pulmonary effort is normal. No respiratory distress.     Breath sounds: No rhonchi or rales.  Abdominal:     General: There is no distension.     Palpations: Abdomen is soft. There is no mass.     Tenderness: There is no abdominal tenderness.  Musculoskeletal: Normal range of motion.  Lymphadenopathy:     Cervical: No cervical adenopathy.  Neurological:     Mental Status: She is alert.     GCS: GCS eye subscore is 4. GCS verbal subscore is 5. GCS motor subscore is 6.     Cranial Nerves: No cranial nerve deficit, dysarthria or facial asymmetry.     Sensory: No sensory deficit.     Motor: No weakness.     Coordination: Coordination normal.      UC Treatments / Results  Labs (all labs ordered are listed, but only abnormal results are displayed) Labs Reviewed - No data to display  EKG   Radiology No results found.  Procedures Procedures (including critical care  time)  Medications Ordered in UC Medications  dexamethasone (DECADRON) injection 10 mg (10 mg Intramuscular Given 12/10/18 2031)  metoCLOPramide (REGLAN) injection 10 mg (10 mg Intramuscular Given 12/10/18 2031)  SUMAtriptan (IMITREX) injection 6 mg (6 mg Subcutaneous Given 12/10/18 2031)  dexamethasone (DECADRON) 10 MG/ML  injection (has no administration in time range)  metoCLOPramide (REGLAN) 5 MG/ML injection (has no administration in time range)  SUMAtriptan (IMITREX) 6 MG/0.5ML injection (has no administration in time range)    Initial Impression / Assessment and Plan / UC Course  I have reviewed the triage vital signs and the nursing notes.  Pertinent labs & imaging results that were available during my care of the patient were reviewed by me and considered in my medical decision making (see chart for details).     1.  Acute migraine without aura: Dexamethasone 10 mg IM x1 dose now, Reglan 10 mg IM x1 dose and subcu Imitrex 6 mg x 1 dose. Imitrex 25 mg to be taken at the outset of the headache and may repeat after 2 hours Zofran 4 mg every 8 hours as needed for nausea/vomiting. If patient's headache persist she will benefit from being evaluated in the emergency department.  Patient has previously been on Aimovig and Topamax.  Interestingly she is never used Imitrex in the past. Final Clinical Impressions(s) / UC Diagnoses   Final diagnoses:  Migraine without aura and without status migrainosus, not intractable   Discharge Instructions   None    ED Prescriptions    Medication Sig Dispense Auth. Provider   SUMAtriptan (IMITREX) 25 MG tablet Please take 1 tablet at the onset of head.May repeat in 2 hours if headache persists or recurs. 10 tablet Donnarae Rae, Britta MccreedyPhilip O, MD   ondansetron (ZOFRAN ODT) 4 MG disintegrating tablet Take 1 tablet (4 mg total) by mouth every 8 (eight) hours as needed for nausea or vomiting. 20 tablet Taryne Kiger, Britta MccreedyPhilip O, MD     PDMP not reviewed this  encounter.   Merrilee JanskyLamptey, Nalah Macioce O, MD 12/12/18 [phone]

## 2019-03-08 ENCOUNTER — Other Ambulatory Visit (HOSPITAL_COMMUNITY): Payer: Self-pay | Admitting: General Surgery

## 2019-03-08 ENCOUNTER — Other Ambulatory Visit: Payer: Self-pay | Admitting: General Surgery

## 2019-04-08 ENCOUNTER — Encounter: Payer: Self-pay | Admitting: Dietician

## 2019-04-08 ENCOUNTER — Other Ambulatory Visit: Payer: Self-pay

## 2019-04-08 ENCOUNTER — Encounter: Payer: Federal, State, Local not specified - PPO | Attending: General Surgery | Admitting: Dietician

## 2019-04-08 VITALS — Ht 68.0 in | Wt 351.7 lb

## 2019-04-08 DIAGNOSIS — M1611 Unilateral primary osteoarthritis, right hip: Secondary | ICD-10-CM | POA: Diagnosis not present

## 2019-04-08 DIAGNOSIS — Z79899 Other long term (current) drug therapy: Secondary | ICD-10-CM | POA: Diagnosis not present

## 2019-04-08 DIAGNOSIS — Z882 Allergy status to sulfonamides status: Secondary | ICD-10-CM | POA: Insufficient documentation

## 2019-04-08 DIAGNOSIS — Z6841 Body Mass Index (BMI) 40.0 and over, adult: Secondary | ICD-10-CM | POA: Diagnosis not present

## 2019-04-08 DIAGNOSIS — Z9049 Acquired absence of other specified parts of digestive tract: Secondary | ICD-10-CM | POA: Insufficient documentation

## 2019-04-08 DIAGNOSIS — Z91012 Allergy to eggs: Secondary | ICD-10-CM | POA: Insufficient documentation

## 2019-04-08 DIAGNOSIS — Z713 Dietary counseling and surveillance: Secondary | ICD-10-CM | POA: Insufficient documentation

## 2019-04-08 DIAGNOSIS — Z87891 Personal history of nicotine dependence: Secondary | ICD-10-CM | POA: Diagnosis not present

## 2019-04-08 DIAGNOSIS — Z885 Allergy status to narcotic agent status: Secondary | ICD-10-CM | POA: Insufficient documentation

## 2019-04-08 DIAGNOSIS — Z8249 Family history of ischemic heart disease and other diseases of the circulatory system: Secondary | ICD-10-CM | POA: Insufficient documentation

## 2019-04-08 DIAGNOSIS — Z833 Family history of diabetes mellitus: Secondary | ICD-10-CM | POA: Diagnosis not present

## 2019-04-08 DIAGNOSIS — R7303 Prediabetes: Secondary | ICD-10-CM | POA: Diagnosis not present

## 2019-04-08 DIAGNOSIS — G43909 Migraine, unspecified, not intractable, without status migrainosus: Secondary | ICD-10-CM | POA: Insufficient documentation

## 2019-04-08 NOTE — Patient Instructions (Signed)
   Plan to have a snack between lunch and dinner, such as fruit and nuts, or whole grain dry cereal snack mix; granola bar with protein and low sugar.   Gradually reduce portions of evening snacks. Include some protein to help with feeling full.   Think of activities that can help with redirecting thoughts away from eating.   Great job reducing carb intake and starting on some exercise, keep up the good work!

## 2019-04-08 NOTE — Progress Notes (Addendum)
Nutrition Assessment  Proposed Surgery: sleeve gastrectomy   Height: 5'8" Weight: 351.7lbs BMI: 53.48 Upper IBW% (UIBW): 226% (UIBW 155lbs)  Patient's Goal Weight: 175lbs  Medical History: pre-diabetes, migraines, bursitis in hip Medications and Supplements: Excedrin migraine prn, busPIRone, Butalbital-APAP-caffeine, cyclobenzaprine prn, melatonin, naproxen prn, prazosin, SUMAtriptan prn, topiramate, Vitamin D  Previous surgeries: shoulder surgery; right hemacholectomy Drug allergies: morphine, egg-based meds Food allergies: eggs Alcohol use: 0-1 drink per month  Tobacco use: former smoker x 10 years; quit 2016  Physical activity: zumba 15 minutes, 1-2 times a week; significant walking at work  Weight history: Childhood: normal    Adolescence: normal    Adulthood: normal weight until 2015-- gained weight rapidly after developing some health issues and starting gabapentin, PTSD meds and more recently steroid injections. Also experiencing sleep issues leading to nighttime cravings and eating, occasionally eating while asleep.    Weight 1 year ago: 280s  Dieting/ weight loss history:  keto -- lost 40lbs, but unable to maintain keto diet due to GI symptoms phentermine -- side effects of headaches.  Most efforts at weight loss have led to some but not enough weight loss  Dietary Recall:  Daily pattern: 2-3 meals and 2 (average) snacks. Dining out: 4-5 meals per week. 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   Instructed her on pre-op diet guidelines, including avoidance of carbonated beverages, caffeine, and sugary beverages, chewing foods thoroughly, and avoiding fluids during meals.   Discussed stages of the bariatric diet after surgery as well as the importance of adequate protein and fluid intake.   Summary:  Patient has begun reducing carb intake and increasing exercise in effort to lose weight and prepare for bariatric surgery.  She has solid support from 2 sons.   She agrees to work on eating at regular intervals, reducing nighttime snacking, and increasing awareness of food intake prior to surgery.   She is motivated to follow the bariatric diet after surgery. From a nutrition standpoint, she is ready to proceed with the bariatric surgery program.    Plan:  Patient commits to returning for 3 supervised weight loss visits + pre-op class prior to surgery.   She will plan to return for post-op RD visits beginning 2 weeks after surgery.   Addendum: 10/31/19 After her nutrition assessment visit on 04/08/19, patient completed 3 monthly weight loss visits, and attended the pre-op class on 06/03/19. She is prepared and ready for following the bariatric diet after surgery, and from a nutrition standpoint, remains a good candidate for bariatric surgery.

## 2019-04-16 ENCOUNTER — Ambulatory Visit (HOSPITAL_COMMUNITY): Payer: Non-veteran care

## 2019-04-22 ENCOUNTER — Encounter (INDEPENDENT_AMBULATORY_CARE_PROVIDER_SITE_OTHER): Payer: Federal, State, Local not specified - PPO | Admitting: Dietician

## 2019-04-22 ENCOUNTER — Other Ambulatory Visit: Payer: Self-pay

## 2019-04-22 ENCOUNTER — Encounter: Payer: Self-pay | Admitting: Dietician

## 2019-04-22 VITALS — Ht 68.0 in | Wt 356.4 lb

## 2019-04-22 DIAGNOSIS — Z6841 Body Mass Index (BMI) 40.0 and over, adult: Secondary | ICD-10-CM | POA: Diagnosis not present

## 2019-04-22 NOTE — Patient Instructions (Signed)
   Continue to reduce portions of cereal -- can add some protein if needed to feel full such as nuts, low fat (part-skim or 2%).   Increase vegetables; have at least one serving with lunch and dinner -- be generous with portions, ideally 1 cup or more. Try mixing veggies into starchy foods like pasta or rice or potatoes to stretch the portions.   Keep portions of starchy foods to 1 cup max, ideally 1/2 - 2/3 cup.  Great job increasing exercise and [employer], keep up the good work!

## 2019-04-22 NOTE — Progress Notes (Signed)
Appt start time: 0940 end time:  1010.  Assessment:   #1/3 SWL Appointment.   Start Wt at [facility]: 351.7lbs Wt: 356.4lbs Ht: 5'8" BMI: 54.19   Learning Readiness:   Change in progress  MEDICATIONS: Excedrin migraine prn, busPIRone, Butalbital-APAP-caffeine, cyclobenzaprine prn, melatonin, naproxen prn, prazosin, SUMAtriptan prn, topiramate, Vitamin D  Progress:  Increase of 4.7lbs in past 2 weeks; patient feels possibly due to no recent BM, increased fluid retention.   Has noticed less veg intake recently.   Work-related stress has been high over the past 2 weeks, which has likely affected some food choices and eating and sleeping patterns.   Dietary intake: Dining out: 2 meals per week Breakfast: mostly skipped for past 2 weeks due to work schedule; ate tuna today; 3/28 fajita with eggs at 10:30am  Snack: none  Lunch: difficult to recall (work schedule busier); some chicken wings (not fried); occ sandwich on pita/ flatbread; shrimp Snack: pork skins, diet soda Dinner: 7-8pm-- seafood- crab legs; stir-fry with veg, bacon, ribs (ate for 3 days); Honey Oat Cheerios-- in plastic cup to replace bowl/ larger portion Snack: Cheerios Beverages: water, some with sugar free flavoring; occasional diet soda, stopped seltzer  Usual physical activity: increased walking on a daily basis  Diet to Follow: Smaller portions of carbohydrates Inclusion of protein sources at regular intervals Higher vegetable intake              Nutritional Diagnosis:  Vandiver-3.3 Overweight/obesity related to history of excess calories and physical inactivity as evidenced by patient current BMI of 54.19, following dietary guidelines for weight loss prior to bariatric surgery.              Intervention:   . Nutrition counseling for weight loss prior to upcoming bariatric surgery.  Teaching Method Utilized:  Visual Auditory Hands on  Handouts given during visit include:  Why We Overeat (article from  [employer])  Goals and instructions (AVS)  Barriers to learning/adherence to lifestyle change: none  Demonstrated degree of understanding via:  Teach Back   Monitoring/Evaluation:  Dietary intake, exercise, and body weight 05/06/19 at 3:45pm.

## 2019-05-06 ENCOUNTER — Other Ambulatory Visit: Payer: Self-pay

## 2019-05-06 ENCOUNTER — Encounter: Payer: Self-pay | Admitting: Dietician

## 2019-05-06 ENCOUNTER — Ambulatory Visit (HOSPITAL_COMMUNITY)
Admission: RE | Admit: 2019-05-06 | Discharge: 2019-05-06 | Disposition: A | Discharge status: Home / Self Care | Payer: Federal, State, Local not specified - PPO | Source: Ambulatory Visit | Attending: General Surgery | Admitting: General Surgery

## 2019-05-06 ENCOUNTER — Encounter: Payer: Federal, State, Local not specified - PPO | Attending: General Surgery | Admitting: Dietician

## 2019-05-06 DIAGNOSIS — M1611 Unilateral primary osteoarthritis, right hip: Secondary | ICD-10-CM | POA: Insufficient documentation

## 2019-05-06 DIAGNOSIS — Z91012 Allergy to eggs: Secondary | ICD-10-CM | POA: Insufficient documentation

## 2019-05-06 DIAGNOSIS — R7303 Prediabetes: Secondary | ICD-10-CM | POA: Insufficient documentation

## 2019-05-06 DIAGNOSIS — Z8249 Family history of ischemic heart disease and other diseases of the circulatory system: Secondary | ICD-10-CM | POA: Insufficient documentation

## 2019-05-06 DIAGNOSIS — Z885 Allergy status to narcotic agent status: Secondary | ICD-10-CM | POA: Insufficient documentation

## 2019-05-06 DIAGNOSIS — Z833 Family history of diabetes mellitus: Secondary | ICD-10-CM | POA: Insufficient documentation

## 2019-05-06 DIAGNOSIS — Z713 Dietary counseling and surveillance: Secondary | ICD-10-CM | POA: Insufficient documentation

## 2019-05-06 DIAGNOSIS — Z6841 Body Mass Index (BMI) 40.0 and over, adult: Secondary | ICD-10-CM | POA: Diagnosis not present

## 2019-05-06 DIAGNOSIS — G43909 Migraine, unspecified, not intractable, without status migrainosus: Secondary | ICD-10-CM | POA: Insufficient documentation

## 2019-05-06 DIAGNOSIS — Z9049 Acquired absence of other specified parts of digestive tract: Secondary | ICD-10-CM | POA: Insufficient documentation

## 2019-05-06 DIAGNOSIS — Z882 Allergy status to sulfonamides status: Secondary | ICD-10-CM | POA: Insufficient documentation

## 2019-05-06 DIAGNOSIS — Z79899 Other long term (current) drug therapy: Secondary | ICD-10-CM | POA: Insufficient documentation

## 2019-05-06 DIAGNOSIS — Z87891 Personal history of nicotine dependence: Secondary | ICD-10-CM | POA: Insufficient documentation

## 2019-05-06 NOTE — Progress Notes (Signed)
Appt start time: 1540 end time:  1600.  Assessment:   #2 SWL Appointment.   Start Wt at [facility]: 351.7lbs (04/08/19) Wt: 355.1lbs Ht: 5'8" BMI: 53.99   Learning Readiness:   Change in progress  MEDICATIONS: acetaminophen, aspirin-acetaminophen-caffeine, busPIRone, butalbital-APAP-caffeine, hydrOXYsine, melatonin, naproxen prn, prazosin, SUMAtriptan prn, topiramate, Trazodone, Vitamin D  Progress:  Weight loss of about 1lb since previous visit 3 weeks ago.   She reports increased intake of low-carb vegetables, and has reduced starchy foods -- cereal in particular.   Reports ongoing constipation; has not yet taken any aids.   Has support from coworker in making healthy diet changes and staying active.  Dietary intake:  Breakfast: banana; later am-- small portion grapes and cheese; protein pack with 5 crackers + salami + cheese Snack: none  Lunch: no restaurant meals recently; salad or homemade soup (okra, zucchini, squash yellow and butternut, spinach, occ sweet potato, cabbage + chicken in chicken broth Snack: occasionally pork skins, often none Dinner: less cereal; homemade soup; eggs and bacon Snack: cucumbers Beverages: mostly water, some with sugar free flavoring; 1-2 diet sodas per week  Usual physical activity: starting to walk on lunch breaks + increased activity throughout the day  Diet to Follow: Continue to gradually decrease carbohydrates and include low-carb veggies              Nutritional Diagnosis:  [place]-3.3 Overweight/obesity related to history of excess calories and physical inactivity as evidenced by patient current BMI of 54, following dietary guidelines for continued weight loss prior to bariatric surgery.              Intervention:   . Nutrition counseling for weight loss prior to upcoming bariatric surgery. . Reviewed progress since previous visit.   Teaching Method Utilized:  Visual Auditory Hands on  Handouts given during visit include:  Goals  and Instructions (AVS)   Barriers to learning/adherence to lifestyle change: none  Demonstrated degree of understanding via:  Teach Back   Monitoring/Evaluation:  Dietary intake, exercise, and body weight 06/03/19 at 9:30am.

## 2019-05-06 NOTE — Patient Instructions (Signed)
   Great job making healthy changes! Keep up the great work!  Continue to keep starchy foods to small portions and eat plenty of low-carb veggies along with lean protein foods.   Stay active as much as possible.

## 2019-06-03 ENCOUNTER — Encounter: Payer: Self-pay | Admitting: Dietician

## 2019-06-03 ENCOUNTER — Ambulatory Visit (INDEPENDENT_AMBULATORY_CARE_PROVIDER_SITE_OTHER): Payer: Federal, State, Local not specified - PPO | Admitting: Psychology

## 2019-06-03 ENCOUNTER — Other Ambulatory Visit: Payer: Self-pay

## 2019-06-03 ENCOUNTER — Encounter: Payer: Federal, State, Local not specified - PPO | Attending: General Surgery | Admitting: Dietician

## 2019-06-03 DIAGNOSIS — Z91012 Allergy to eggs: Secondary | ICD-10-CM | POA: Insufficient documentation

## 2019-06-03 DIAGNOSIS — R7303 Prediabetes: Secondary | ICD-10-CM | POA: Insufficient documentation

## 2019-06-03 DIAGNOSIS — Z6841 Body Mass Index (BMI) 40.0 and over, adult: Secondary | ICD-10-CM | POA: Diagnosis not present

## 2019-06-03 DIAGNOSIS — Z885 Allergy status to narcotic agent status: Secondary | ICD-10-CM | POA: Insufficient documentation

## 2019-06-03 DIAGNOSIS — M1611 Unilateral primary osteoarthritis, right hip: Secondary | ICD-10-CM | POA: Insufficient documentation

## 2019-06-03 DIAGNOSIS — Z9049 Acquired absence of other specified parts of digestive tract: Secondary | ICD-10-CM | POA: Insufficient documentation

## 2019-06-03 DIAGNOSIS — Z882 Allergy status to sulfonamides status: Secondary | ICD-10-CM | POA: Insufficient documentation

## 2019-06-03 DIAGNOSIS — F509 Eating disorder, unspecified: Secondary | ICD-10-CM

## 2019-06-03 DIAGNOSIS — G43909 Migraine, unspecified, not intractable, without status migrainosus: Secondary | ICD-10-CM | POA: Insufficient documentation

## 2019-06-03 DIAGNOSIS — Z833 Family history of diabetes mellitus: Secondary | ICD-10-CM | POA: Insufficient documentation

## 2019-06-03 DIAGNOSIS — Z87891 Personal history of nicotine dependence: Secondary | ICD-10-CM | POA: Insufficient documentation

## 2019-06-03 DIAGNOSIS — Z8249 Family history of ischemic heart disease and other diseases of the circulatory system: Secondary | ICD-10-CM | POA: Insufficient documentation

## 2019-06-03 DIAGNOSIS — Z79899 Other long term (current) drug therapy: Secondary | ICD-10-CM | POA: Insufficient documentation

## 2019-06-03 DIAGNOSIS — Z713 Dietary counseling and surveillance: Secondary | ICD-10-CM | POA: Insufficient documentation

## 2019-06-03 NOTE — Progress Notes (Signed)
Appt start time: 0930 end time:  1000.  Assessment:   #3 of 3 SWL Appointment.   Start Wt at [facility]: 356.4lbs Wt: 345.9lbs Ht: 5'8" BMI: 52.59   Learning Readiness:   Change in progress  MEDICATIONS: acetaminophen prn, aspirin-acetaminophen-caffeine prn, busPIRone, butalbital-caffeine, cyclobenzaprine prn, hydrOXYzine prn, Melatonin, naproxen prn, prazosin, SUMAtriptan prn, topiramate, Trazodone, Vitamin D  Progress:  Weight loss of 9.2lbs since previous visit on 05/06/19.   Patient reports feeling bloated due to onset of menstruation today.   Patient reports continued effort to increase dietary fiber as well as water to help with constipation. She is also working to reduce amount and frequency of starchy foods.   She is engaging in physical activity on a regular basis.    Dietary intake:  Breakfast: 7-7:30am-- banana or grapes and cheese; had croissant once  Snack: pork skins  Lunch: bbq/ chicken + green beans/ low carb veg. Snack: fruit ie watermelon Dinner: less food recently; often just nuts and watermelon recently; cereal 1-2 times Snack: none recently Beverages: mostly water sometimes with sf flavoring; diet/ zero sugar soda occasionally  Usual physical activity: walking on lunch breaks + increased steps/ activity throughout the day.  Diet to Follow: Continue to emphasize lean protein foods and low-carb veggies, controlled portions of fruits and only small amounts of starchy foods.                Nutritional Diagnosis:  [place]-3.3 Overweight/obesity related to history of excess calories and physical inactivity as evidenced by patient current BMI of 52.6, following dietary guidelines for continued weight loss prior to bariatric surgery.              Intervention:   . Nutrition counseling for weight loss prior to upcoming bariatric surgery. . Discussed basics of pre-op diet.  . Encouraged patient to begin sampling protein shakes and reviewed importance of high protein,  low carb content.  . Reviewed importance of considering surgery as a tool and not a solution for weight control, and importance of maintaining healthy diet and exercise routines to ensure long term success.  Teaching Method Utilized:  Visual Auditory Hands on   Barriers to learning/adherence to lifestyle change: none  Demonstrated degree of understanding via:  Teach Back   Monitoring/Evaluation:  Dietary intake, exercise, and body weight     follow up: pre-op class tentatively scheduled for 06/14/19.

## 2019-06-03 NOTE — Patient Instructions (Signed)
   Continue with current eating pattern.   Keep up regular exercise without straining ankles.

## 2019-06-12 ENCOUNTER — Ambulatory Visit (INDEPENDENT_AMBULATORY_CARE_PROVIDER_SITE_OTHER): Payer: Federal, State, Local not specified - PPO | Admitting: Psychology

## 2019-06-12 DIAGNOSIS — F509 Eating disorder, unspecified: Secondary | ICD-10-CM

## 2019-06-14 ENCOUNTER — Other Ambulatory Visit: Payer: Self-pay

## 2019-06-14 ENCOUNTER — Encounter: Payer: Federal, State, Local not specified - PPO | Admitting: Dietician

## 2019-06-14 DIAGNOSIS — Z6841 Body Mass Index (BMI) 40.0 and over, adult: Secondary | ICD-10-CM

## 2019-06-14 NOTE — Progress Notes (Signed)
Pre-Operative Nutrition Class:  Appt start time: 0900   End time:  1100.  Patient was seen on Jun 14, 2019 for Pre-Operative Bariatric Surgery Education at [facility] at [facility].   Surgery date: TBD Surgery type: VSG Start weight at [facility]: 351.7 lbs  Weight today: 349.4 lbs  InBody  BODY COMP RESULTS    BMI (kg/m^2) 53.1  Fat Mass (lbs) 179.4  Dry Lean Mass (lbs) 44.8  Total Body Water (lbs) 125.2   Samples given per MNT protocol. Patient educated on appropriate usage:  Lot number Expiration date  Celebrate Vitamins RNY pack:  Calcium soft chews cherry    1070   09/2020  Calcium soft chews watermelon 0309 05/2020  Calcium soft chews straw-ban cream 1011 07/2020  Calcium soft chews caf mocha 5170 05/2020  Calcium soft chews blackberry 0426 03/2020  Calcium soft chews orange 0275 03/2020  Calcium soft chews caramel 0349 06/2020  Calcium soft chews chocolate 1010 07/2020  Calcium soft chew Isl. fruit 0338 06/2020  Calcium soft chew lemon cream 1032 08/2020    Calcium (tablet) 500 cherry tart [phone] 01/2020  B12 Quick-melt Cherry   [phone] 01/2021       Iron soft chews '45mg'$  cherry burst [id number] 06/2020  Iron soft chews '60mg'$  twisted citrus [id number]  06/2020  Iron 45 tab grape [phone] 08/2020      Multivitamin soft chews very cherry 21011B6 07/2020  Multivitamin soft chews strawberry 09233A0 07/2020  Multi complete 45 watermelon [phone] 04/2020  Multivitamin grape [phone] 05/2020      Celebrate Vitamins bulk packs:     Calcium soft chews watermelon 0309 05/2020  Calcium soft chew Isl. fruit 0338 06/2020  Calcium soft chew lemon cream 1032 08/2020      Multivitamin soft chews very cherry 21011B6 07/2020  Multivitamin soft chews strawberry 35456Y5 07/2020    Premier Beverages:  Clear protein drink    Vanilla shake [id number] 11/02/2019  Chocolate shake [id number] 09/22/2019         Unjury products:  Vanilla shake    [id number]   05/20/2020  Chocolate shake [id number] 05/21/2020    Unflavored powder [id number] 11/2020  Chocolate powder 680321 03/2020  Chicken soup powder 224825 11/2020     The following the learning objectives were met by the patient during this course:  Identify Pre-Op Dietary Goals and will begin 2 weeks pre-operatively  Identify appropriate sources of fluids and proteins   State protein recommendations and appropriate sources pre and post-operatively  Identify Post-Operative Dietary Goals and will follow for 2 weeks post-operatively  Identify appropriate multivitamin and calcium sources  Describe the need for physical activity post-operatively and will follow MD recommendations  State when to call healthcare provider regarding medication questions or post-operative complications  Handouts given during class include:  Pre-Op Bariatric Surgery Diet Handout  Protein Shake Handout  Post-Op Bariatric Surgery Nutrition Handout  BELT Program Information Flyer  Support Group Information Flyer  [facility] Bariatric Supplements Price List  Follow-Up Plan: Patient will follow-up at [place], at about 2 weeks post operatively for diet advancement per MD.

## 2019-07-01 ENCOUNTER — Ambulatory Visit (INDEPENDENT_AMBULATORY_CARE_PROVIDER_SITE_OTHER): Payer: Federal, State, Local not specified - PPO

## 2019-07-01 ENCOUNTER — Other Ambulatory Visit: Payer: Self-pay

## 2019-07-01 ENCOUNTER — Ambulatory Visit (INDEPENDENT_AMBULATORY_CARE_PROVIDER_SITE_OTHER): Payer: Federal, State, Local not specified - PPO | Admitting: Podiatry

## 2019-07-01 ENCOUNTER — Other Ambulatory Visit: Payer: Self-pay | Admitting: Podiatry

## 2019-07-01 ENCOUNTER — Encounter: Payer: Self-pay | Admitting: Podiatry

## 2019-07-01 VITALS — Temp 97.2°F | Resp 16

## 2019-07-01 DIAGNOSIS — M779 Enthesopathy, unspecified: Secondary | ICD-10-CM | POA: Diagnosis not present

## 2019-07-01 DIAGNOSIS — M722 Plantar fascial fibromatosis: Secondary | ICD-10-CM

## 2019-07-01 DIAGNOSIS — M79672 Pain in left foot: Secondary | ICD-10-CM | POA: Diagnosis not present

## 2019-07-01 DIAGNOSIS — M25572 Pain in left ankle and joints of left foot: Secondary | ICD-10-CM | POA: Diagnosis not present

## 2019-07-01 NOTE — Patient Instructions (Signed)

## 2019-07-01 NOTE — Progress Notes (Signed)
° °  Subjective:    Patient ID: Meghan Orr, female    DOB: 07/24/1970, 49 y.o.   MRN: [id number]  HPI    Review of Systems  All other systems reviewed and are negative.      Objective:   Physical Exam        Assessment & Plan:

## 2019-07-01 NOTE — Progress Notes (Signed)
Subjective:   Patient ID: Meghan Orr, female   DOB: 49 y.o.   MRN: [id number]   HPI Patient presents stating that she has a lot of pain in her left ankle that is been going on for several months and she has had previous braces and orthotics and states that it gets quite inflamed and makes it hard for her to walk comfortably.  She is due for bariatric surgery would like to be more active but the left foot is keeping her from doing this.  Patient does not smoke likes to be active   Review of Systems  All other systems reviewed and are negative.       Objective:  Physical Exam Vitals and nursing note reviewed.  Constitutional:      Appearance: She is well-developed.  Pulmonary:     Effort: Pulmonary effort is normal.  Musculoskeletal:        General: Normal range of motion.  Skin:    General: Skin is warm.  Neurological:     Mental Status: She is alert.     Neurovascular status was found to be intact muscle strength was adequate.  Range of motion within normal limits and patient is noted to have acute inflammation of the left sinus tarsi with fluid buildup noted within the sinus tarsi with moderate collapse of the arch but no loss of motion or crepitus.  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  Sterile prep done today and I injected the sinus tarsi left 3 mg Kenalog 5 mg Xylocaine and recommended good support.  Patient will be seen back to reevaluate  X-rays indicate that there is no signs of arthritis around the joint with moderate depression of the arch bilateral

## 2019-07-11 ENCOUNTER — Ambulatory Visit (INDEPENDENT_AMBULATORY_CARE_PROVIDER_SITE_OTHER): Payer: Federal, State, Local not specified - PPO | Admitting: Podiatry

## 2019-07-11 ENCOUNTER — Encounter: Payer: Self-pay | Admitting: Podiatry

## 2019-07-11 ENCOUNTER — Other Ambulatory Visit: Payer: Self-pay

## 2019-07-11 DIAGNOSIS — M779 Enthesopathy, unspecified: Secondary | ICD-10-CM

## 2019-07-11 DIAGNOSIS — M25572 Pain in left ankle and joints of left foot: Secondary | ICD-10-CM

## 2019-07-12 NOTE — Progress Notes (Signed)
Subjective:   Patient ID: Meghan Orr, female   DOB: 49 y.o.   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  Today I went ahead I want to try conservative again with complete immobilization and she does have a boot at home and she will begin wearing the boot and I did do sterile prep and injected into the sinus tarsi 3 mg Kenalog 5 mg Xylocaine and I want her to wear the boot 3 weeks full-time and then wean herself off and I will see her back in 4 weeks and decide whether MRI will be necessary

## 2019-08-12 ENCOUNTER — Encounter: Payer: Self-pay | Admitting: Podiatry

## 2019-08-12 ENCOUNTER — Ambulatory Visit (INDEPENDENT_AMBULATORY_CARE_PROVIDER_SITE_OTHER): Payer: Federal, State, Local not specified - PPO | Admitting: Podiatry

## 2019-08-12 ENCOUNTER — Other Ambulatory Visit: Payer: Self-pay

## 2019-08-12 DIAGNOSIS — M25572 Pain in left ankle and joints of left foot: Secondary | ICD-10-CM | POA: Diagnosis not present

## 2019-08-12 DIAGNOSIS — T148XXA Other injury of unspecified body region, initial encounter: Secondary | ICD-10-CM

## 2019-08-12 NOTE — Progress Notes (Signed)
Subjective:   Patient ID: Meghan Orr, female   DOB: 49 y.o.   MRN: [id number]   HPI Patient presents stating that the left ankle is still sore in the outside of the left foot is still very tender   ROS      Objective:  Physical Exam  Neurovascular status intact with pain along the peroneal tendon as it comes underneath the lateral malleolus and the stands until the fifth metatarsal base along with continued discomfort in the sinus tarsi left with obesity is complicating factor     Assessment:  Possibility for a peroneal tendon tear or injury versus possibility for subtalar joint arthritis or other pathology     Plan:  Given the fact that this is been present for a number of years it has not responded to immobilization injection and anti-inflammatories we are going to do an MRI to try to understand better why this is continue to be such a problem for scheduled for MRI

## 2019-08-14 ENCOUNTER — Ambulatory Visit: Payer: Self-pay | Admitting: General Surgery

## 2019-08-14 NOTE — H&P (Signed)
Stan[facility] Breed Appointment: 08/14/2019 9:15 AM Location: [facility] Patient #: [id number] DOB: 03/29/1970 Divorced / Language: Meghan Orr / Race: Black or African American Female  History of Present Illness Meghan Orr; 08/14/2019 9:53 AM) The patient is a 49 year old female who presents for a bariatric surgery evaluation. She comes in for follow-up regarding her severe obesity. I initially met her in January of this year. She has completed her bariatric surgery evaluation. She is received clearance from the psychologist. She denies any medical changes since she was last seen. She did have a steroid injection her left foot at the beginning of July. Otherwise no trips to the emergency room hospital. She denies any new medications. She is still having ankle issues and is scheduled to get an MRI. She denies any chest pain, chest pressure, source of breath, dyspnea on exertion, orthopnea. She underwent an upper GI which showed a small hiatal hernia without reflux or esophageal dysmotility. Her lab work was unremarkable except for an A1c of 6. No tobacco use.    02/17/19 She is referred by Dr Venora Maples at the [facility] in [place]. She completed our seminar online. She is initially interested in the laparoscopic adjustable gastric band because she is interested in the least invasive procedure. However she is interested in hearing about other options. She is interested in being more healthy and being able to move around easier.  Despite numerous attempts for sustained weight loss she has been unsuccessful. She has tried low calorie diets, ketogenic diet as well as phentermine-all without any long-term success. She was able to lose 40 pounds with a ketogenic diet but regained the weight.  Her comorbidities include right hip osteoarthritis, prediabetes, and migraines, food related heartburn  She denies any chest pain, chest pressure, shortness of breath, orthopnea, paroxysmal  nocturnal dyspnea, dyspnea on exertion, TIAs or amaurosis fugax. She denies any personal family history of blood clots. She denies a diagnosis of hyperlipidemia. She will occasionally have lower extremity edema around her ankles. She states that she had a negative sleep study in September 2020. She states that she does have some heartburn and reflux but is generally food related. She may have to take Tums a few times a month. She has a daily bowel movement. 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 Denies any melena hematochezia. She denies any dysuria.  She has hip pain on the right side as well as left ankle pain. She is received a steroid injection for her right hip. She has chronic migraines. She will have several attacks per month.  She denies any tobacco or drugs. She may have alcohol a few times a month.  I reviewed an ER note from November 2020   Problem List/Past Medical Meghan Hiss M. Redmond Pulling, Orr; 08/14/2019 9:55 AM) HIP OSTEOMYELITIS, RIGHT (M86.9) ENCOUNTER FOR PRE-OPERATIVE LABORATORY TESTING (Z01.812) CHRONIC MIGRAINE WITHOUT AURA WITHOUT STATUS MIGRAINOSUS, NOT INTRACTABLE (G43.709) PREDIABETES (R73.03) SLIDING HIATAL HERNIA (K44.9)  Past Surgical History Meghan Hiss M. Redmond Pulling, Orr; 08/14/2019 9:55 AM) Shoulder Surgery Right.  Diagnostic Studies History Meghan Hiss M. Redmond Pulling, Orr; 08/14/2019 9:55 AM) Colonoscopy 5-10 years ago Mammogram 1-3 years ago Pap Smear 1-5 years ago  Allergies (Meghan Orr, [place]; 08/14/2019 9:32 AM) Egg Sulfa Drugs Morphine Sulfate (Concentrate) *ANALGESICS - OPIOID* Allergies Reconciled  Medication History (Meghan Orr, [place]; 08/14/2019 9:32 AM) SUMAtriptan Succinate ('25MG'$  Tablet, Oral) Active. Topamax ('200MG'$  Tablet, Oral) Active. BuSpar ('10MG'$  Tablet, Oral) Active. Flexeril ('10MG'$  Tablet, Oral) Active.  hydrOXYzine HCl ('10MG'$  Tablet, Oral) Active. Sertraline HCl ('100MG'$  Tablet, Oral)  Active. Prazosin HCl ('2MG'$  Capsule, Oral) Active. Medications Reconciled  Social History Meghan Hiss M. Redmond Pulling, Orr; 08/14/2019 9:55 AM) Alcohol use Occasional alcohol use. Caffeine use Coffee. No drug use Tobacco use Former smoker.  Family History Meghan Hiss M. Redmond Pulling, Orr; 08/14/2019 9:55 AM) Diabetes Mellitus Father. Heart Disease Father. Hypertension Father.  Pregnancy / Birth History Meghan Hiss M. Redmond Pulling, Orr; 08/14/2019 9:55 AM) Age at menarche 39 years. Contraceptive History Intrauterine device. Gravida 3 Irregular periods Length (months) of breastfeeding 7-12 Maternal age 31-25 Para 2  Other Problems Meghan Hiss M. Redmond Pulling, Orr; 08/14/2019 9:55 AM) Anxiety Disorder Migraine Headache MORBID OBESITY, UNSPECIFIED OBESITY TYPE (E66.01)     Review of Systems Meghan Orr; 08/14/2019 9:53 AM) General Not Present- Appetite Loss, Chills, Fatigue, Fever, Night Sweats, Weight Gain and Weight Loss. HEENT Not Present- Earache, Hearing Loss, Hoarseness, Nose Bleed, Oral Ulcers, Ringing in the Ears, Seasonal Allergies, Sinus Pain, Sore Throat, Visual Disturbances, Wears glasses/contact lenses and Yellow Eyes. Respiratory Not Present- Bloody sputum, Chronic Cough, Difficulty Breathing, Snoring and Wheezing. Breast Not Present- Breast Mass, Breast Pain, Nipple Discharge and Skin Changes. Cardiovascular Not Present- Chest Pain, Difficulty Breathing Lying Down, Leg Cramps, Palpitations, Rapid Heart Rate, Shortness of Breath and Swelling of Extremities. Gastrointestinal Not Present- Abdominal Pain, Bloating, Bloody Stool, Change in Bowel Habits, Chronic diarrhea, Constipation, Difficulty Swallowing, Excessive gas, Gets full quickly at meals, Hemorrhoids, Indigestion, Nausea, Rectal Pain and Vomiting. Musculoskeletal Present- Joint Pain. Not Present- Back Pain, Joint Stiffness, Muscle Pain, Muscle Weakness and Swelling of Extremities. Neurological Not Present- Decreased Memory, Fainting,  Headaches, Numbness, Seizures, Tingling, Tremor, Trouble walking and Weakness. Psychiatric Not Present- Anxiety, Bipolar, Change in Sleep Pattern, Depression, Fearful and Frequent crying. Endocrine Not Present- Cold Intolerance, Excessive Hunger, Hair Changes, Heat Intolerance, Hot flashes and New Diabetes. Hematology Not Present- Blood Thinners, Easy Bruising, Excessive bleeding, Gland problems, HIV and Persistent Infections.  Vitals (Meghan Orr RMA; 08/14/2019 9:32 AM) 08/14/2019 9:32 AM Weight: 346.6 lb Height: 66.5in Body Surface Area: 2.54 m Body Mass Index: 55.1 kg/m  Temp.: 98.20F  Pulse: 95 (Regular)  BP: 132/86(Sitting, Left Arm, Standard)        Physical Exam Meghan Orr; 08/14/2019 9:53 AM)  The physical exam findings are as follows: Note:severe obesity  General Mental Status-Alert. General Appearance-Consistent with stated age. Hydration-Well hydrated. Voice-Normal.  Head and Neck Head-normocephalic, atraumatic with no lesions or palpable masses. Trachea-midline. Thyroid Gland Characteristics - normal size and consistency.  Eye Eyeball - Bilateral-Extraocular movements intact. Sclera/Conjunctiva - Bilateral-No scleral icterus.  Chest and Lung Exam Chest and lung exam reveals -quiet, even and easy respiratory effort with no use of accessory muscles and on auscultation, normal breath sounds, no adventitious sounds and normal vocal resonance. Inspection Chest Wall - Normal. Back - normal.  Breast - Did not examine.  Cardiovascular Cardiovascular examination reveals -normal heart sounds, regular rate and rhythm with no murmurs and normal pedal pulses bilaterally.  Abdomen Inspection Inspection of the abdomen reveals - No Hernias. Skin - Scar - Note: small mini-midline incision. Palpation/Percussion Palpation and Percussion of the abdomen reveal - Soft, Non Tender, No Rebound tenderness, No Rigidity (guarding)  and No hepatosplenomegaly. Auscultation Auscultation of the abdomen reveals - Bowel sounds normal.  Peripheral Vascular Upper Extremity Palpation - Pulses bilaterally normal.  Neurologic Neurologic evaluation reveals -alert and oriented x 3 with no impairment of recent or remote memory. Mental Status-Normal.  Neuropsychiatric The patient's mood and  affect are described as -normal. Judgment and Insight-insight is appropriate concerning matters relevant to self.  Musculoskeletal Normal Exam - Left-Upper Extremity Strength Normal and Lower Extremity Strength Normal. Normal Exam - Right-Upper Extremity Strength Normal and Lower Extremity Strength Normal.  Lymphatic Head & Neck  General Head & Neck Lymphatics: Bilateral - Description - Normal. Axillary - Did not examine. Femoral & Inguinal - Did not examine.    Assessment & Plan Meghan Orr; 08/14/2019 9:55 AM)  HIP OSTEOMYELITIS, RIGHT (M86.9)   SEVERE OBESITY (E66.01) Impression: The patient meets weight loss surgery criteria. I think the patient would be an acceptable candidate for Laparoscopic vertical sleeve gastrectomy B  We reviewed her workup to date. We discussed the findings of a small hiatal hernia on her upper GI. We discussed that I would recommend repair during her sleeve gastrectomy. We discussed what that would involve. She has decided to proceed with sleeve gastrectomy. I offered to rediscussed the actual steps of the procedure as well as the inherent risk and complications but she declined. She was given a copy of the consent form which she read and signed. We rediscussed the typical hospitalization in the typical postoperative recovery. All of her questions were asked and answered. We discussed the importance of the preoperative meal plan.  Current Plans Pt Education - EMW_preopbariatric  CHRONIC MIGRAINE WITHOUT AURA WITHOUT STATUS MIGRAINOSUS, NOT INTRACTABLE (G43.709)   SLIDING HIATAL  HERNIA (K44.9)   PREDIABETES (R73.03)  Meghan Ruff. Redmond Pulling, [place], FACS [facility] [place]

## 2019-10-31 ENCOUNTER — Encounter: Payer: Self-pay | Admitting: Dietician

## 2019-11-27 ENCOUNTER — Ambulatory Visit (HOSPITAL_COMMUNITY)
Admission: EM | Admit: 2019-11-27 | Discharge: 2019-11-27 | Disposition: A | Discharge status: Home / Self Care | Payer: Federal, State, Local not specified - PPO

## 2019-11-27 ENCOUNTER — Encounter (HOSPITAL_COMMUNITY): Payer: Self-pay

## 2019-11-27 ENCOUNTER — Other Ambulatory Visit: Payer: Self-pay

## 2019-11-27 DIAGNOSIS — M5442 Lumbago with sciatica, left side: Secondary | ICD-10-CM

## 2019-11-27 NOTE — ED Provider Notes (Signed)
[facility]    CSN: [id number] Arrival date & time: 11/27/19  1113      History   Chief Complaint Chief Complaint  Patient presents with  . Back Pain    HPI Meghan Orr is a 49 y.o. female presenting today for evaluation of lower back pain.  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  Since she has had pain as well as a burning sensation radiating into her left glutes.  Denies urinary symptoms.  Denies saddle anesthesia.  Denies loss of control of bowel/bladder.  Denies weakness in legs.  Denies any fall or direct trauma to back during incident.  Has upcoming bariatric surgery on 11/22 and would like to defer any steroids.  HPI  Past Medical History:  Diagnosis Date  . Headache     There are no problems to display for this patient.   Past Surgical History:  Procedure Laterality Date  . SHOULDER ARTHROSCOPY Right     OB History   No obstetric history on file.      Home Medications    Prior to Admission medications   Medication Sig Start Date End Date Taking? Authorizing Provider  acetaminophen (TYLENOL) 325 MG tablet Take 650 mg by mouth every 6 (six) hours as needed.    [provider]  aspirin-acetaminophen-caffeine (EXCEDRIN MIGRAINE) [phone] MG tablet Take by mouth every 6 (six) hours as needed for headache.    [provider]  busPIRone (BUSPAR) 10 MG tablet Take 10 mg by mouth 3 (three) times daily.    [provider]  Butalbital-APAP-Caffeine (FIORICET PO) Take by mouth.    [provider]  cyclobenzaprine (FLEXERIL) 10 MG tablet Take 10 mg by mouth 3 (three) times daily as needed for muscle spasms.    [provider]  hydrOXYzine (ATARAX/VISTARIL) 10 MG tablet Take 10 mg by mouth 3 (three) times daily as needed.    [provider]  Melatonin 10 MG TABS Take by mouth.    [provider]  naproxen  (NAPROSYN) 500 MG tablet Take 1 tablet (500 mg total) by mouth 2 (two) times daily as needed for mild pain, moderate pain or headache (TAKE WITH MEALS.). 10/07/14   Street, [place], PA-C  prazosin (MINIPRESS) 2 MG capsule Take 2 mg by mouth at bedtime.    [provider]  SUMAtriptan (IMITREX) 25 MG tablet Please take 1 tablet at the onset of head.May repeat in 2 hours if headache persists or recurs. 12/10/18   Merrilee Jansky, MD  topiramate (TOPAMAX) 200 MG tablet Topamax 200 mg tablet  Take 1 tablet twice a day by oral route.    [provider]  TRAZODONE HCL PO Take by mouth.    [provider]  VITAMIN D, CHOLECALCIFEROL, PO Take by mouth.    [provider]    Family History Family History  Problem Relation Age of Onset  . Diabetes Father   . Heart attack Father   . Meniere's disease Sister     Social History Social History   Tobacco Use  . Smoking status: Former Smoker    Types: Cigarettes    Quit date: 01/25/2015    Years since quitting: 4.8  . Smokeless tobacco: Never Used  Substance Use Topics  . Alcohol use: Yes    Comment: 0-1 drink per week  . Drug use: No     Allergies  Eggs or egg-derived products, Morphine, and Sulfa antibiotics   Review of Systems Review of Systems  Constitutional: Negative for fatigue and fever.  Eyes: Negative for visual disturbance.  Respiratory: Negative for shortness of breath.   Cardiovascular: Negative for chest pain.  Gastrointestinal: Negative for abdominal pain, nausea and vomiting.  Musculoskeletal: Positive for back pain and myalgias. Negative for arthralgias and joint swelling.  Skin: Negative for color change, rash and wound.  Neurological: Negative for dizziness, weakness, light-headedness and headaches.     Physical Exam Triage Vital Signs ED Triage Vitals  Enc Vitals Group     BP      Pulse      Resp      Temp      Temp src      SpO2      Weight      Height      Head  Circumference      Peak Flow      Pain Score      Pain Loc      Pain Edu?      Excl. in GC?    No data found.  Updated Vital Signs BP (!) 150/85 (BP Location: Left Wrist)   Pulse 89   Temp 98.9 F (37.2 C) (Oral)   Resp (!) 21   SpO2 100%   Visual Acuity Right Eye Distance:   Left Eye Distance:   Bilateral Distance:    Right Eye Near:   Left Eye Near:    Bilateral Near:     Physical Exam Vitals and nursing note reviewed.  Constitutional:      Appearance: She is well-developed.     Comments: No acute distress  HENT:     Head: Normocephalic and atraumatic.     Nose: Nose normal.  Eyes:     Conjunctiva/sclera: Conjunctivae normal.  Cardiovascular:     Rate and Rhythm: Normal rate.  Pulmonary:     Effort: Pulmonary effort is normal. No respiratory distress.  Abdominal:     General: There is no distension.  Musculoskeletal:        General: Normal range of motion.     Cervical back: Neck supple.     Comments: Diffuse tenderness throughout lumbar spine midline, increased tenderness throughout left lumbar musculature extending to upper gluteal area  Strength at hips and knees 5/5 ankle bilaterally, patellar reflex difficult to obtain bilaterally, ambulating independently, transfers from chair to exam table without assistance  Skin:    General: Skin is warm and dry.  Neurological:     Mental Status: She is alert and oriented to person, place, and time.      UC Treatments / Results  Labs (all labs ordered are listed, but only abnormal results are displayed) Labs Reviewed - No data to display  EKG   Radiology No results found.  Procedures Procedures (including critical care time)  Medications Ordered in UC Medications - No data to display  Initial Impression / Assessment and Plan / UC Course  I have reviewed the triage vital signs and the nursing notes.  Pertinent labs & imaging results that were available during my care of the patient were reviewed by  me and considered in my medical decision making (see chart for details).     Suspect lower lumbar strain with radicular distribution, low suspicion of acute abnormality, deferring imaging.  Continue NSAIDs muscle relaxers, activity modification.  Discussed strict return precautions. Patient verbalized understanding and is agreeable with plan.  Final  Clinical Impressions(s) / UC Diagnoses   Final diagnoses:  Acute left-sided low back pain with left-sided sciatica   Discharge Instructions   None    ED Prescriptions    None     PDMP not reviewed this encounter.   Lew Dawes, PA-C 11/27/19 1319

## 2019-11-27 NOTE — ED Triage Notes (Signed)
Pt reports having dull, sharp, burning pain in the left buttock and lower back pain since this morning. 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

## 2019-11-28 ENCOUNTER — Telehealth: Payer: Self-pay | Admitting: Dietician

## 2019-11-28 NOTE — Telephone Encounter (Signed)
Called patient for pre-op nutrition update. She does have materials from the pre-op class she attended 06/14/19. She was able to recall basics of the pre-op diet, knows sources of protein and low-carb vegetables. Reviewed pre-od diet guidelines, importance of making low fat and low sugar choices. Reviewed phase 1 and 2 of post-op diet, goals for daily fluid and protein intake, restrictions regarding fluid choices. Encouraged patient to call back with any questions or concerns that may arise.

## 2019-12-02 ENCOUNTER — Ambulatory Visit: Payer: Self-pay | Admitting: General Surgery

## 2019-12-05 ENCOUNTER — Other Ambulatory Visit: Payer: Self-pay

## 2019-12-05 ENCOUNTER — Ambulatory Visit (HOSPITAL_COMMUNITY)
Admission: EM | Admit: 2019-12-05 | Discharge: 2019-12-05 | Disposition: A | Discharge status: Home / Self Care | Payer: Federal, State, Local not specified - PPO | Attending: Family Medicine | Admitting: Family Medicine

## 2019-12-05 ENCOUNTER — Encounter (HOSPITAL_COMMUNITY): Payer: Self-pay | Admitting: Emergency Medicine

## 2019-12-05 DIAGNOSIS — M545 Low back pain, unspecified: Secondary | ICD-10-CM

## 2019-12-05 MED ORDER — KETOROLAC TROMETHAMINE 60 MG/2ML IM SOLN
INTRAMUSCULAR | Status: AC
  Filled 2019-12-05: qty 2

## 2019-12-05 MED ORDER — KETOROLAC TROMETHAMINE 60 MG/2ML IM SOLN
60.0000 mg | Freq: Once | INTRAMUSCULAR | Status: AC
  Administered 2019-12-05: 60 mg via INTRAMUSCULAR

## 2019-12-05 NOTE — Discharge Instructions (Addendum)

## 2019-12-05 NOTE — ED Provider Notes (Signed)
[facility]   [id number] 12/05/19 Arrival Time: 1557  ASSESSMENT & PLAN:  1. Acute bilateral low back pain without sciatica     Able to ambulate here and hemodynamically stable. No indication for imaging of back at this time given no trauma and normal neurological exam. Discussed.  Given: Meds ordered this encounter  Medications  . ketorolac (TORADOL) injection 60 mg    Work note with modified duty for one week given. Encourage ROM/movement as tolerated.  Recommend:  Follow-up Information    [facility]   Specialty: General Practice Why: If worsening or failing to improve as anticipated. Contact information: [address] [place] [address] [phone]               Reviewed expectations re: course of current medical issues. Questions answered. Outlined signs and symptoms indicating need for more acute intervention. Patient verbalized understanding. After Visit Summary given.   SUBJECTIVE: History from: patient.  Meghan Orr is a 49 y.o. 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 Has been on NSAID and muscle relaxer without much relief. No extremity sensation changes or weakness. Normal bowel/bladder habits. Followed by [facility]. Trying to stay mobile. Has been going to work.  Reports no chronic steroid use, fevers, IV drug use, or recent back surgeries or procedures.   OBJECTIVE:  Vitals:   12/05/19 1654  BP: (!) 147/87  Pulse: 88  Resp: (!) 21  Temp: 99.2 F (37.3 C)  TempSrc: Oral  SpO2: 96%    Recheck RR: 18  General appearance: alert; no distress but appears uncomfortable HEENT: [place]; AT Neck: supple with FROM; without midline tenderness Lungs: unlabored respirations; speaks full sentences without difficulty Back: vague TTP reported over bilateral lumbar paraspinal musculature (L>R); FROM at waist; bruising: none; without  midline tenderness Extremities: without edema; symmetrical without gross deformities; normal ROM of bilateral LE Skin: warm and dry Neurologic: normal gait; normal sensation and strength of bilateral LE Psychological: alert and cooperative; normal mood and affect   Allergies  Allergen Reactions  . Eggs Or Egg-Derived Products Hives and Swelling    Facial Swelling  . Morphine Hives  . Sulfa Antibiotics Nausea And Vomiting    Past Medical History:  Diagnosis Date  . Headache    Social History   Socioeconomic History  . Marital status: Divorced    Spouse name: Not on file  . Number of children: 2  . Years of education: Not on file  . Highest education level: Not on file  Occupational History  . Occupation: optomitrist tech  Tobacco Use  . Smoking status: Former Smoker    Types: Cigarettes    Quit date: 01/25/2015    Years since quitting: 4.8  . Smokeless tobacco: Never Used  Substance and Sexual Activity  . Alcohol use: Yes    Comment: 0-1 drink per week  . Drug use: No  . Sexual activity: Not on file  Other Topics Concern  . Not on file  Social History Narrative   Patient is right-handed. She is divorced and lives in a 2 story house with her son. She drinks 2 cups of coffee a day and a soda QOD. She is active at work.   Social Determinants of Health   Financial Resource Strain:   . Difficulty of Paying Living Expenses: Not on file  Food Insecurity:   . Worried About Programme researcher, broadcasting/film/video in the  Last Year: Not on file  . Ran Out of Food in the Last Year: Not on file  Transportation Needs:   . Lack of Transportation (Medical): Not on file  . Lack of Transportation (Non-Medical): Not on file  Physical Activity:   . Days of Exercise per Week: Not on file  . Minutes of Exercise per Session: Not on file  Stress:   . Feeling of Stress : Not on file  Social Connections:   . Frequency of Communication with Friends and Family: Not on file  . Frequency of Social  Gatherings with Friends and Family: Not on file  . Attends Religious Services: Not on file  . Active Member of Clubs or Organizations: Not on file  . Attends Banker Meetings: Not on file  . Marital Status: Not on file  Intimate Partner Violence:   . Fear of Current or Ex-Partner: Not on file  . Emotionally Abused: Not on file  . Physically Abused: Not on file  . Sexually Abused: Not on file   Family History  Problem Relation Age of Onset  . Diabetes Father   . Heart attack Father   . Meniere's disease Sister    Past Surgical History:  Procedure Laterality Date  . SHOULDER ARTHROSCOPY Right      Mardella Layman, MD 12/05/19 [phone]

## 2019-12-05 NOTE — ED Triage Notes (Signed)
Reports provider is out at employee health, therefore no one there to see her.   Patient was seen 11/27/2019 at [facility] about back pain Patient is scheduled for bariatric surgery on 12/16/2019.    Patient is being encouraged to stop ibuprofen, but patient continues to hurt in back Gutier[place], shooting pain in lower back  Patient was seen at [facility] on Tuesday and was told this was a muscle strain.

## 2019-12-06 NOTE — Patient Instructions (Addendum)
DUE TO COVID-19 ONLY ONE VISITOR IS ALLOWED TO COME WITH YOU AND STAY IN THE WAITING ROOM ONLY DURING PRE OP AND PROCEDURE DAY OF SURGERY. THE 1 VISITOR  MAY VISIT WITH YOU AFTER SURGERY IN YOUR PRIVATE ROOM DURING VISITING HOURS ONLY!  YOU NEED TO HAVE A COVID 19 TEST ON: 12/12/19 @ 2:45 PM , THIS TEST MUST BE DONE BEFORE SURGERY,  COVID TESTING SITE [address] [place] [address], IT IS ON THE RIGHT GOING OUT [address] APPROXIMATELY  2 MINUTES PAST [employer] ON THE RIGHT. ONCE YOUR COVID TEST IS COMPLETED,  PLEASE BEGIN THE QUARANTINE INSTRUCTIONS AS OUTLINED IN YOUR HANDOUT.                Meghan Orr    Your procedure is scheduled on: 12/16/19   Report to [facility] Main  Entrance   Report to admitting at: 10:30 AM     Call this number if you have problems the morning of surgery [phone]    Remember:   MORNING OF SURGERY DRINK:   DRINK 1 G2 drink BEFORE YOU LEAVE HOME, DRINK ALL OF THE  G2 DRINK AT ONE TIME.   NO SOLID FOOD AFTER 600 PM THE NIGHT BEFORE YOUR SURGERY. YOU MAY DRINK CLEAR FLUIDS. THE G2 DRINK YOU DRINK BEFORE YOU LEAVE HOME WILL BE THE LAST FLUIDS YOU DRINK BEFORE SURGERY. At: 9:30 AM.  PAIN IS EXPECTED AFTER SURGERY AND WILL NOT BE COMPLETELY ELIMINATED. AMBULATION AND TYLENOL WILL HELP REDUCE INCISIONAL AND GAS PAIN. MOVEMENT IS KEY!  YOU ARE EXPECTED TO BE OUT OF BED WITHIN 4 HOURS OF ADMISSION TO YOUR PATIENT ROOM.  SITTING IN THE RECLINER THROUGHOUT THE DAY IS IMPORTANT FOR DRINKING FLUIDS AND MOVING GAS THROUGHOUT THE GI TRACT.  COMPRESSION STOCKINGS SHOULD BE WORN [facility] STAY UNLESS YOU ARE WALKING.   INCENTIVE SPIROMETER SHOULD BE USED EVERY HOUR WHILE AWAKE TO DECREASE POST-OPERATIVE COMPLICATIONS SUCH AS PNEUMONIA.  WHEN DISCHARGED HOME, IT IS IMPORTANT TO CONTINUE TO WALK EVERY HOUR AND USE THE INCENTIVE SPIROMETER EVERY HOUR.   CLEAR LIQUID DIET   Foods Allowed                                                                      Foods Excluded  Coffee and tea, regular and decaf                             liquids that you cannot  Plain Jell-O any favor except red or purple                                           see through such as: Fruit ices (not with fruit pulp)                                     milk, soups, orange juice  Iced Popsicles  All solid food Carbonated beverages, regular and diet                                    Cranberry, grape and apple juices Sports drinks like Gatorade Lightly seasoned clear broth or consume(fat free) Sugar, honey syrup  Sample Menu Breakfast                                Lunch                                     Supper Cranberry juice                    Beef broth                            Chicken broth Jell-O                                     Grape juice                           Apple juice Coffee or tea                        Jell-O                                      Popsicle                                                Coffee or tea                        Coffee or tea  _____________________________________________________________________   BRUSH YOUR TEETH MORNING OF SURGERY AND RINSE YOUR MOUTH OUT, NO CHEWING GUM CANDY OR MINTS.                               You may not have any metal on your body including hair pins and              piercings  Do not wear jewelry, make-up, lotions, powders or perfumes, deodorant             Do not wear nail polish on your fingernails.  Do not shave  48 hours prior to surgery.              Do not bring valuables to the hospital. [facility] IS NOT             RESPONSIBLE   FOR VALUABLES.  Contacts, dentures or bridgework may not be worn into surgery.  Leave suitcase in the car. After surgery it may be brought to your room.     Patients discharged the day of surgery will not be allowed to drive home. IF YOU  ARE HAVING SURGERY AND GOING HOME  THE SAME DAY, YOU MUST HAVE AN ADULT TO DRIVE YOU HOME AND BE WITH YOU FOR 24 HOURS. YOU MAY GO HOME BY TAXI OR UBER OR ORTHERWISE, BUT AN ADULT MUST ACCOMPANY YOU HOME AND STAY WITH YOU FOR 24 HOURS.  Name and phone number of your driver:  Special Instructions: N/A              Please read over the following fact sheets you were given: _____________________________________________________________________          [facility] Pre[facility] Before surgery, you can play an important role.  Because skin is not sterile, your skin needs to be as free of germs as possible.  You can reduce the number of germs on your skin by washing with CHG (chlorahexidine gluconate) soap before surgery.  CHG is an antiseptic cleaner which kills germs and bonds with the skin to continue killing germs even after washing. Please DO NOT use if you have an allergy to CHG or antibacterial soaps.  If your skin becomes reddened/irritated stop using the CHG and inform your nurse when you arrive at Short Stay. Do not shave (including legs and underarms) for at least 48 hours prior to the first CHG shower.  You may shave your face/neck. Please follow these instructions carefully:  1.  Shower with CHG Soap the night before surgery and the  morning of Surgery.  2.  If you choose to wash your hair, wash your hair first as usual with your  normal  shampoo.  3.  After you shampoo, rinse your hair and body thoroughly to remove the  shampoo.                           4.  Use CHG as you would any other liquid soap.  You can apply chg directly  to the skin and wash                       Gently with a scrungie or clean washcloth.  5.  Apply the CHG Soap to your body ONLY FROM THE NECK DOWN.   Do not use on face/ open                           Wound or open sores. Avoid contact with eyes, ears mouth and genitals (private parts).                       Wash face,  Genitals (private parts) with your normal soap.             6.  Wash  thoroughly, paying special attention to the area where your surgery  will be performed.  7.  Thoroughly rinse your body with warm water from the neck down.  8.  DO NOT shower/wash with your normal soap after using and rinsing off  the CHG Soap.                9.  Pat yourself dry with a clean towel.            10.  Wear clean pajamas.            11.  Place clean sheets on your bed the night of your first shower and do not  sleep with pets. Day of Surgery : Do not  apply any lotions/deodorants the morning of surgery.  Please wear clean clothes to the hospital/surgery center.  FAILURE TO FOLLOW THESE INSTRUCTIONS MAY RESULT IN THE CANCELLATION OF YOUR SURGERY PATIENT SIGNATURE_________________________________  NURSE SIGNATURE__________________________________  ________________________________________________________________________   Meghan Orr  An incentive spirometer is a tool that can help keep your lungs clear and active. This tool measures how well you are filling your lungs with each breath. Taking long deep breaths may help reverse or decrease the chance of developing breathing (pulmonary) problems (especially infection) following:  A long period of time when you are unable to move or be active. BEFORE THE PROCEDURE   If the spirometer includes an indicator to show your best effort, your nurse or respiratory therapist will set it to a desired goal.  If possible, sit up straight or lean slightly forward. Try not to slouch.  Hold the incentive spirometer in an upright position. INSTRUCTIONS FOR USE  1. Sit on the edge of your bed if possible, or sit up as far as you can in bed or on a chair. 2. Hold the incentive spirometer in an upright position. 3. Breathe out normally. 4. Place the mouthpiece in your mouth and seal your lips tightly around it. 5. Breathe in slowly and as deeply as possible, raising the piston or the ball toward the top of the column. 6. Hold your  breath for 3-5 seconds or for as long as possible. Allow the piston or ball to fall to the bottom of the column. 7. Remove the mouthpiece from your mouth and breathe out normally. 8. Rest for a few seconds and repeat Steps 1 through 7 at least 10 times every 1-2 hours when you are awake. Take your time and take a few normal breaths between deep breaths. 9. The spirometer may include an indicator to show your best effort. Use the indicator as a goal to work toward during each repetition. 10. After each set of 10 deep breaths, practice coughing to be sure your lungs are clear. If you have an incision (the cut made at the time of surgery), support your incision when coughing by placing a pillow or rolled up towels firmly against it. Once you are able to get out of bed, walk around indoors and cough well. You may stop using the incentive spirometer when instructed by your caregiver.  RISKS AND COMPLICATIONS  Take your time so you do not get dizzy or light-headed.  If you are in pain, you may need to take or ask for pain medication before doing incentive spirometry. It is harder to take a deep breath if you are having pain. AFTER USE  Rest and breathe slowly and easily.  It can be helpful to keep track of a log of your progress. Your caregiver can provide you with a simple table to help with this. If you are using the spirometer at home, follow these instructions: SEEK MEDICAL CARE IF:   You are having difficultly using the spirometer.  You have trouble using the spirometer as often as instructed.  Your pain medication is not giving enough relief while using the spirometer.  You develop fever of 100.5 F (38.1 C) or higher. SEEK IMMEDIATE MEDICAL CARE IF:   You cough up bloody sputum that had not been present before.  You develop fever of 102 F (38.9 C) or greater.  You develop worsening pain at or near the incision site. MAKE SURE YOU:   Understand these instructions.  Will watch  your condition.  Will get help right away if you are not doing well or get worse. Document Released: 05/23/2006 Document Revised: 04/04/2011 Document Reviewed: 07/24/2006 [facility] Patient Information 2014 [place], [place].   ________________________________________________________________________

## 2019-12-09 ENCOUNTER — Encounter (HOSPITAL_COMMUNITY)
Admission: RE | Admit: 2019-12-09 | Discharge: 2019-12-09 | Disposition: A | Discharge status: Home / Self Care | Payer: Federal, State, Local not specified - PPO | Source: Ambulatory Visit | Attending: General Surgery | Admitting: General Surgery

## 2019-12-09 ENCOUNTER — Other Ambulatory Visit: Payer: Self-pay

## 2019-12-09 ENCOUNTER — Encounter (HOSPITAL_COMMUNITY): Payer: Self-pay

## 2019-12-09 DIAGNOSIS — Z01812 Encounter for preprocedural laboratory examination: Secondary | ICD-10-CM | POA: Insufficient documentation

## 2019-12-09 HISTORY — DX: Depression, unspecified: F32.A

## 2019-12-09 HISTORY — DX: Anxiety disorder, unspecified: F41.9

## 2019-12-09 HISTORY — DX: Unspecified osteoarthritis, unspecified site: M19.90

## 2019-12-09 HISTORY — DX: Prediabetes: R73.03

## 2019-12-09 LAB — CBC WITH DIFFERENTIAL/PLATELET
Abs Immature Granulocytes: 0.01 10*3/uL (ref 0.00–0.07)
Basophils Absolute: 0 10*3/uL (ref 0.0–0.1)
Basophils Relative: 0 %
Eosinophils Absolute: 0.1 10*3/uL (ref 0.0–0.5)
Eosinophils Relative: 1 %
HCT: 40.8 % (ref 36.0–46.0)
Hemoglobin: 13.4 g/dL (ref 12.0–15.0)
Immature Granulocytes: 0 %
Lymphocytes Relative: 43 %
Lymphs Abs: 2.7 10*3/uL (ref 0.7–4.0)
MCH: 26.7 pg (ref 26.0–34.0)
MCHC: 32.8 g/dL (ref 30.0–36.0)
MCV: 81.3 fL (ref 80.0–100.0)
Monocytes Absolute: 0.4 10*3/uL (ref 0.1–1.0)
Monocytes Relative: 7 %
Neutro Abs: 3 10*3/uL (ref 1.7–7.7)
Neutrophils Relative %: 49 %
Platelets: 312 10*3/uL (ref 150–400)
RBC: 5.02 MIL/uL (ref 3.87–5.11)
RDW: 14.7 % (ref 11.5–15.5)
WBC: 6.3 10*3/uL (ref 4.0–10.5)
nRBC: 0 % (ref 0.0–0.2)

## 2019-12-09 LAB — COMPREHENSIVE METABOLIC PANEL
ALT: 16 U/L (ref 0–44)
AST: 21 U/L (ref 15–41)
Albumin: 4.2 g/dL (ref 3.5–5.0)
Alkaline Phosphatase: 65 U/L (ref 38–126)
Anion gap: 9 (ref 5–15)
BUN: 14 mg/dL (ref 6–20)
CO2: 26 mmol/L (ref 22–32)
Calcium: 9.4 mg/dL (ref 8.9–10.3)
Chloride: 103 mmol/L (ref 98–111)
Creatinine, Ser: 0.71 mg/dL (ref 0.44–1.00)
GFR, Estimated: >60 mL/min (ref >60)
Glucose, Bld: 105 mg/dL — ABNORMAL HIGH (ref 70–99)
Potassium: 4.1 mmol/L (ref 3.5–5.1)
Sodium: 138 mmol/L (ref 135–145)
Total Bilirubin: 0.6 mg/dL (ref 0.3–1.2)
Total Protein: 7.8 g/dL (ref 6.5–8.1)

## 2019-12-09 LAB — HEMOGLOBIN A1C
Hgb A1c MFr Bld: 6.4 % — ABNORMAL HIGH (ref 4.8–5.6)
Mean Plasma Glucose: 136.98 mg/dL

## 2019-12-09 NOTE — Progress Notes (Addendum)
COVID Vaccine Completed: Yes Date COVID Vaccine completed: 3/21 COVID vaccine manufacturer:  Fransisca Kaufmann  PCP - [facility]  Cardiologist - .  Chest x-ray -  EKG - Stress Test -  ECHO -  Cardiac Cath -  Pacemaker/ICD device last checked:  Sleep Study -  CPAP -   Fasting Blood Sugar -  Checks Blood Sugar _____ times a day  Blood Thinner Instructions: Aspirin Instructions:  instructions. Last Dose:  Anesthesia review: .  Patient denies shortness of breath, fever, cough and chest pain at PAT appointment   Patient verbalized understanding of instructions that were given to them at the PAT appointment. Patient was also instructed that they will need to review over the PAT instructions again at home before surgery.

## 2019-12-12 ENCOUNTER — Other Ambulatory Visit (HOSPITAL_COMMUNITY)
Admission: RE | Admit: 2019-12-12 | Discharge: 2019-12-12 | Disposition: A | Discharge status: Home / Self Care | Payer: Federal, State, Local not specified - PPO | Source: Ambulatory Visit | Attending: General Surgery | Admitting: General Surgery

## 2019-12-12 DIAGNOSIS — Z20822 Contact with and (suspected) exposure to covid-19: Secondary | ICD-10-CM | POA: Diagnosis not present

## 2019-12-12 DIAGNOSIS — Z01812 Encounter for preprocedural laboratory examination: Secondary | ICD-10-CM | POA: Diagnosis present

## 2019-12-13 LAB — SARS CORONAVIRUS 2 (TAT 6-24 HRS): SARS Coronavirus 2: NEGATIVE

## 2019-12-15 MED ORDER — BUPIVACAINE LIPOSOME 1.3 % IJ SUSP
20.0000 mL | Freq: Once | INTRAMUSCULAR | Status: DC
  Filled 2019-12-15: qty 20

## 2019-12-16 ENCOUNTER — Encounter (HOSPITAL_COMMUNITY): Payer: Self-pay | Admitting: General Surgery

## 2019-12-16 ENCOUNTER — Other Ambulatory Visit: Payer: Self-pay

## 2019-12-16 ENCOUNTER — Inpatient Hospital Stay (HOSPITAL_COMMUNITY): Payer: No Typology Code available for payment source | Admitting: Certified Registered Nurse Anesthetist

## 2019-12-16 ENCOUNTER — Encounter (HOSPITAL_COMMUNITY): Payer: Federal, State, Local not specified - PPO

## 2019-12-16 ENCOUNTER — Inpatient Hospital Stay (HOSPITAL_COMMUNITY)
Admission: RE | Admit: 2019-12-16 | Discharge: 2019-12-17 | DRG: 621 | Disposition: A | Discharge status: Home / Self Care | Payer: No Typology Code available for payment source | Attending: General Surgery | Admitting: General Surgery

## 2019-12-16 ENCOUNTER — Encounter (HOSPITAL_COMMUNITY)
Admission: RE | Disposition: A | Discharge status: Home / Self Care | Payer: Self-pay | Source: Home / Self Care | Attending: General Surgery

## 2019-12-16 DIAGNOSIS — Z833 Family history of diabetes mellitus: Secondary | ICD-10-CM | POA: Diagnosis not present

## 2019-12-16 DIAGNOSIS — R7303 Prediabetes: Secondary | ICD-10-CM | POA: Diagnosis present

## 2019-12-16 DIAGNOSIS — Z8249 Family history of ischemic heart disease and other diseases of the circulatory system: Secondary | ICD-10-CM

## 2019-12-16 DIAGNOSIS — M1611 Unilateral primary osteoarthritis, right hip: Secondary | ICD-10-CM | POA: Diagnosis present

## 2019-12-16 DIAGNOSIS — Z6841 Body Mass Index (BMI) 40.0 and over, adult: Secondary | ICD-10-CM | POA: Diagnosis not present

## 2019-12-16 DIAGNOSIS — Z882 Allergy status to sulfonamides status: Secondary | ICD-10-CM | POA: Diagnosis not present

## 2019-12-16 DIAGNOSIS — Z87891 Personal history of nicotine dependence: Secondary | ICD-10-CM

## 2019-12-16 DIAGNOSIS — Z8669 Personal history of other diseases of the nervous system and sense organs: Secondary | ICD-10-CM

## 2019-12-16 DIAGNOSIS — I10 Essential (primary) hypertension: Secondary | ICD-10-CM | POA: Diagnosis present

## 2019-12-16 DIAGNOSIS — Z885 Allergy status to narcotic agent status: Secondary | ICD-10-CM | POA: Diagnosis not present

## 2019-12-16 DIAGNOSIS — Z9884 Bariatric surgery status: Secondary | ICD-10-CM

## 2019-12-16 DIAGNOSIS — Z79899 Other long term (current) drug therapy: Secondary | ICD-10-CM | POA: Diagnosis not present

## 2019-12-16 HISTORY — PX: UPPER GI ENDOSCOPY: SHX6162

## 2019-12-16 LAB — HEMOGLOBIN AND HEMATOCRIT, BLOOD
HCT: 42 % (ref 36.0–46.0)
Hemoglobin: 13.8 g/dL (ref 12.0–15.0)

## 2019-12-16 LAB — GLUCOSE, CAPILLARY
Glucose-Capillary: 144 mg/dL — ABNORMAL HIGH (ref 70–99)
Glucose-Capillary: 130 mg/dL — ABNORMAL HIGH (ref 70–99)

## 2019-12-16 LAB — TYPE AND SCREEN
ABO/RH(D): A POS
Antibody Screen: NEGATIVE

## 2019-12-16 LAB — PREGNANCY, URINE: Preg Test, Ur: NEGATIVE

## 2019-12-16 LAB — ABO/RH: ABO/RH(D): A POS

## 2019-12-16 SURGERY — XI ROBOTIC GASTRIC SLEEVE RESECTION
Anesthesia: General | Site: Abdomen

## 2019-12-16 MED ORDER — PRAZOSIN HCL 1 MG PO CAPS
2.0000 mg | ORAL_CAPSULE | Freq: Every day | ORAL | Status: DC
  Administered 2019-12-16: 2 mg via ORAL
  Filled 2019-12-16: qty 2

## 2019-12-16 MED ORDER — ROCURONIUM BROMIDE 10 MG/ML (PF) SYRINGE
PREFILLED_SYRINGE | INTRAVENOUS | Status: AC
  Filled 2019-12-16: qty 10

## 2019-12-16 MED ORDER — DEXAMETHASONE SODIUM PHOSPHATE 10 MG/ML IJ SOLN
INTRAMUSCULAR | Status: AC
  Filled 2019-12-16: qty 1

## 2019-12-16 MED ORDER — HEPARIN SODIUM (PORCINE) 5000 UNIT/ML IJ SOLN
5000.0000 [IU] | INTRAMUSCULAR | Status: AC
  Administered 2019-12-16: 5000 [IU] via SUBCUTANEOUS
  Filled 2019-12-16: qty 1

## 2019-12-16 MED ORDER — LACTATED RINGERS IV SOLN: INTRAVENOUS | Status: DC

## 2019-12-16 MED ORDER — FENTANYL CITRATE (PF) 100 MCG/2ML IJ SOLN
25.0000 ug | INTRAMUSCULAR | Status: DC | PRN
  Administered 2019-12-16: 25 ug via INTRAVENOUS
  Filled 2019-12-16: qty 2

## 2019-12-16 MED ORDER — MIDAZOLAM HCL 5 MG/5ML IJ SOLN
INTRAMUSCULAR | Status: DC | PRN
  Administered 2019-12-16: 2 mg via INTRAVENOUS

## 2019-12-16 MED ORDER — SODIUM CHLORIDE 0.9 % IV SOLN
2.0000 g | INTRAVENOUS | Status: AC
  Administered 2019-12-16: 2 g via INTRAVENOUS
  Filled 2019-12-16: qty 2

## 2019-12-16 MED ORDER — LIDOCAINE 2% (20 MG/ML) 5 ML SYRINGE
INTRAMUSCULAR | Status: DC | PRN
  Administered 2019-12-16: 1.5 mg/kg/h via INTRAVENOUS

## 2019-12-16 MED ORDER — PROMETHAZINE HCL 25 MG/ML IJ SOLN: 12.5000 mg | Freq: Four times a day (QID) | INTRAMUSCULAR | Status: DC | PRN

## 2019-12-16 MED ORDER — CHLORHEXIDINE GLUCONATE 0.12 % MT SOLN
15.0000 mL | Freq: Once | OROMUCOSAL | Status: AC
  Administered 2019-12-16: 15 mL via OROMUCOSAL

## 2019-12-16 MED ORDER — FENTANYL CITRATE (PF) 250 MCG/5ML IJ SOLN
INTRAMUSCULAR | Status: DC | PRN
Start: 2019-12-16 — End: 2019-12-16
  Administered 2019-12-16: 100 ug via INTRAVENOUS
  Administered 2019-12-16 (×4): 50 ug via INTRAVENOUS

## 2019-12-16 MED ORDER — LIDOCAINE HCL 2 % IJ SOLN
INTRAMUSCULAR | Status: AC
  Filled 2019-12-16: qty 40

## 2019-12-16 MED ORDER — ORAL CARE MOUTH RINSE: 15.0000 mL | Freq: Once | OROMUCOSAL | Status: AC

## 2019-12-16 MED ORDER — ACETAMINOPHEN 500 MG PO TABS
1000.0000 mg | ORAL_TABLET | Freq: Three times a day (TID) | ORAL | Status: DC
  Administered 2019-12-17: 1000 mg via ORAL
  Filled 2019-12-16 (×2): qty 2

## 2019-12-16 MED ORDER — ROCURONIUM BROMIDE 10 MG/ML (PF) SYRINGE
PREFILLED_SYRINGE | INTRAVENOUS | Status: DC | PRN
  Administered 2019-12-16: 80 mg via INTRAVENOUS
  Administered 2019-12-16 (×2): 10 mg via INTRAVENOUS

## 2019-12-16 MED ORDER — HYDROXYZINE HCL 10 MG PO TABS
10.0000 mg | ORAL_TABLET | Freq: Three times a day (TID) | ORAL | Status: DC | PRN
  Filled 2019-12-16: qty 1

## 2019-12-16 MED ORDER — ACETAMINOPHEN 160 MG/5ML PO SOLN
1000.0000 mg | Freq: Three times a day (TID) | ORAL | Status: DC
  Administered 2019-12-16: 1000 mg via ORAL
  Filled 2019-12-16: qty 40.6

## 2019-12-16 MED ORDER — SUCCINYLCHOLINE CHLORIDE 200 MG/10ML IV SOSY
PREFILLED_SYRINGE | INTRAVENOUS | Status: DC | PRN
  Administered 2019-12-16: 140 mg via INTRAVENOUS

## 2019-12-16 MED ORDER — MIDAZOLAM HCL 2 MG/2ML IJ SOLN
INTRAMUSCULAR | Status: AC
  Filled 2019-12-16: qty 2

## 2019-12-16 MED ORDER — HYDRALAZINE HCL 20 MG/ML IJ SOLN: 10.0000 mg | INTRAMUSCULAR | Status: DC | PRN

## 2019-12-16 MED ORDER — FENTANYL CITRATE (PF) 100 MCG/2ML IJ SOLN
INTRAMUSCULAR | Status: AC
  Filled 2019-12-16: qty 2

## 2019-12-16 MED ORDER — CHLORHEXIDINE GLUCONATE 4 % EX LIQD: 60.0000 mL | Freq: Once | CUTANEOUS | Status: DC

## 2019-12-16 MED ORDER — DEXAMETHASONE SODIUM PHOSPHATE 4 MG/ML IJ SOLN: 4.0000 mg | INTRAMUSCULAR | Status: DC

## 2019-12-16 MED ORDER — LIDOCAINE 2% (20 MG/ML) 5 ML SYRINGE
INTRAMUSCULAR | Status: AC
  Filled 2019-12-16: qty 5

## 2019-12-16 MED ORDER — ONDANSETRON HCL 4 MG/2ML IJ SOLN
INTRAMUSCULAR | Status: DC | PRN
  Administered 2019-12-16: 4 mg via INTRAVENOUS

## 2019-12-16 MED ORDER — FENTANYL CITRATE (PF) 250 MCG/5ML IJ SOLN
INTRAMUSCULAR | Status: AC
  Filled 2019-12-16: qty 5

## 2019-12-16 MED ORDER — SODIUM CHLORIDE (PF) 0.9 % IJ SOLN
INTRAMUSCULAR | Status: AC
  Filled 2019-12-16: qty 50

## 2019-12-16 MED ORDER — SUGAMMADEX SODIUM 500 MG/5ML IV SOLN
INTRAVENOUS | Status: DC | PRN
  Administered 2019-12-16: 320 mg via INTRAVENOUS

## 2019-12-16 MED ORDER — PROPOFOL 10 MG/ML IV BOLUS
INTRAVENOUS | Status: AC
  Filled 2019-12-16: qty 20

## 2019-12-16 MED ORDER — ONDANSETRON HCL 4 MG/2ML IJ SOLN
INTRAMUSCULAR | Status: AC
  Filled 2019-12-16: qty 2

## 2019-12-16 MED ORDER — ACETAMINOPHEN 500 MG PO TABS
1000.0000 mg | ORAL_TABLET | ORAL | Status: AC
  Administered 2019-12-16: 1000 mg via ORAL
  Filled 2019-12-16: qty 2

## 2019-12-16 MED ORDER — GABAPENTIN 300 MG PO CAPS
300.0000 mg | ORAL_CAPSULE | ORAL | Status: AC
  Administered 2019-12-16: 300 mg via ORAL
  Filled 2019-12-16: qty 1

## 2019-12-16 MED ORDER — TOPIRAMATE 100 MG PO TABS
200.0000 mg | ORAL_TABLET | Freq: Two times a day (BID) | ORAL | Status: DC
  Administered 2019-12-16 – 2019-12-17 (×2): 200 mg via ORAL
  Filled 2019-12-16 (×2): qty 2

## 2019-12-16 MED ORDER — KETAMINE HCL 10 MG/ML IJ SOLN
INTRAMUSCULAR | Status: DC | PRN
  Administered 2019-12-16: 30 mg via INTRAVENOUS

## 2019-12-16 MED ORDER — LIDOCAINE 2% (20 MG/ML) 5 ML SYRINGE
INTRAMUSCULAR | Status: DC | PRN
  Administered 2019-12-16: 100 mg via INTRAVENOUS

## 2019-12-16 MED ORDER — INSULIN ASPART 100 UNIT/ML ~~LOC~~ SOLN
0.0000 [IU] | SUBCUTANEOUS | Status: DC
  Administered 2019-12-16 – 2019-12-17 (×4): 3 [IU] via SUBCUTANEOUS

## 2019-12-16 MED ORDER — FENTANYL CITRATE (PF) 100 MCG/2ML IJ SOLN
25.0000 ug | INTRAMUSCULAR | Status: DC | PRN
  Administered 2019-12-16: 50 ug via INTRAVENOUS

## 2019-12-16 MED ORDER — APREPITANT 40 MG PO CAPS
40.0000 mg | ORAL_CAPSULE | ORAL | Status: AC
  Administered 2019-12-16: 40 mg via ORAL
  Filled 2019-12-16: qty 1

## 2019-12-16 MED ORDER — SUGAMMADEX SODIUM 500 MG/5ML IV SOLN
INTRAVENOUS | Status: AC
  Filled 2019-12-16: qty 10

## 2019-12-16 MED ORDER — SUGAMMADEX SODIUM 500 MG/5ML IV SOLN
INTRAVENOUS | Status: AC
  Filled 2019-12-16: qty 5

## 2019-12-16 MED ORDER — OXYCODONE HCL 5 MG/5ML PO SOLN
5.0000 mg | Freq: Four times a day (QID) | ORAL | Status: DC | PRN
  Administered 2019-12-16: 5 mg via ORAL
  Filled 2019-12-16: qty 5

## 2019-12-16 MED ORDER — SIMETHICONE 80 MG PO CHEW
80.0000 mg | CHEWABLE_TABLET | Freq: Four times a day (QID) | ORAL | Status: DC | PRN
  Administered 2019-12-16: 80 mg via ORAL
  Filled 2019-12-16: qty 1

## 2019-12-16 MED ORDER — TRAZODONE HCL 100 MG PO TABS
100.0000 mg | ORAL_TABLET | Freq: Every day | ORAL | Status: DC
  Administered 2019-12-16: 100 mg via ORAL
  Filled 2019-12-16: qty 1

## 2019-12-16 MED ORDER — ENOXAPARIN SODIUM 30 MG/0.3ML ~~LOC~~ SOLN
30.0000 mg | Freq: Two times a day (BID) | SUBCUTANEOUS | Status: DC
  Administered 2019-12-16 – 2019-12-17 (×2): 30 mg via SUBCUTANEOUS
  Filled 2019-12-16 (×2): qty 0.3

## 2019-12-16 MED ORDER — PANTOPRAZOLE SODIUM 40 MG IV SOLR
40.0000 mg | Freq: Every day | INTRAVENOUS | Status: DC
  Administered 2019-12-16: 40 mg via INTRAVENOUS
  Filled 2019-12-16: qty 40

## 2019-12-16 MED ORDER — BUPIVACAINE LIPOSOME 1.3 % IJ SUSP
INTRAMUSCULAR | Status: DC | PRN
  Administered 2019-12-16: 20 mL

## 2019-12-16 MED ORDER — PROPOFOL 10 MG/ML IV BOLUS
INTRAVENOUS | Status: DC | PRN
  Administered 2019-12-16: 200 mg via INTRAVENOUS

## 2019-12-16 MED ORDER — SCOPOLAMINE 1 MG/3DAYS TD PT72: 1.0000 | MEDICATED_PATCH | Freq: Once | TRANSDERMAL | Status: DC

## 2019-12-16 MED ORDER — FENTANYL CITRATE (PF) 100 MCG/2ML IJ SOLN
INTRAMUSCULAR | Status: AC
  Administered 2019-12-16: 50 ug via INTRAVENOUS
  Filled 2019-12-16: qty 2

## 2019-12-16 MED ORDER — ONDANSETRON HCL 4 MG/2ML IJ SOLN: 4.0000 mg | Freq: Once | INTRAMUSCULAR | Status: DC | PRN

## 2019-12-16 MED ORDER — SODIUM CHLORIDE (PF) 0.9 % IJ SOLN
INTRAMUSCULAR | Status: DC | PRN
  Administered 2019-12-16: 50 mL

## 2019-12-16 MED ORDER — DEXAMETHASONE SODIUM PHOSPHATE 10 MG/ML IJ SOLN
INTRAMUSCULAR | Status: DC | PRN
  Administered 2019-12-16: 6 mg via INTRAVENOUS

## 2019-12-16 MED ORDER — LIDOCAINE 5 % EX PTCH
1.0000 | MEDICATED_PATCH | Freq: Every day | CUTANEOUS | Status: DC
  Administered 2019-12-16: 1 via TRANSDERMAL
  Filled 2019-12-16 (×2): qty 1

## 2019-12-16 MED ORDER — 0.9 % SODIUM CHLORIDE (POUR BTL) OPTIME
TOPICAL | Status: DC | PRN
  Administered 2019-12-16: 1000 mL

## 2019-12-16 MED ORDER — ONDANSETRON HCL 4 MG/2ML IJ SOLN
4.0000 mg | Freq: Four times a day (QID) | INTRAMUSCULAR | Status: DC | PRN
  Administered 2019-12-17 (×2): 4 mg via INTRAVENOUS
  Filled 2019-12-16 (×3): qty 2

## 2019-12-16 MED ORDER — PHENYLEPHRINE 40 MCG/ML (10ML) SYRINGE FOR IV PUSH (FOR BLOOD PRESSURE SUPPORT)
PREFILLED_SYRINGE | INTRAVENOUS | Status: AC
  Filled 2019-12-16: qty 20

## 2019-12-16 MED ORDER — ENSURE MAX PROTEIN PO LIQD
2.0000 [oz_av] | ORAL | Status: DC
  Administered 2019-12-17 (×3): 2 [oz_av] via ORAL

## 2019-12-16 MED ORDER — SCOPOLAMINE 1 MG/3DAYS TD PT72
1.0000 | MEDICATED_PATCH | TRANSDERMAL | Status: DC
  Administered 2019-12-16: 1.5 mg via TRANSDERMAL
  Filled 2019-12-16: qty 1

## 2019-12-16 MED ORDER — POTASSIUM CHLORIDE IN NACL 20-0.9 MEQ/L-% IV SOLN
INTRAVENOUS | Status: DC
  Filled 2019-12-16 (×2): qty 1000

## 2019-12-16 MED ORDER — GABAPENTIN 100 MG PO CAPS
200.0000 mg | ORAL_CAPSULE | Freq: Two times a day (BID) | ORAL | Status: DC
  Administered 2019-12-16 – 2019-12-17 (×2): 200 mg via ORAL
  Filled 2019-12-16: qty 2

## 2019-12-16 MED ORDER — LACTATED RINGERS IR SOLN
Status: DC | PRN
  Administered 2019-12-16: 1000 mL

## 2019-12-16 SURGICAL SUPPLY — 71 items
APPLIER CLIP 5 13 M/L LIGAMAX5 (MISCELLANEOUS)
APPLIER CLIP ROT 10 11.4 M/L (STAPLE)
BLADE SURG SZ11 CARB STEEL (BLADE) ×3 IMPLANT
CANNULA REDUC XI 12-8 STAPL (CANNULA) ×1
CANNULA REDUCER 12-8 DVNC XI (CANNULA) ×2 IMPLANT
CHLORAPREP W/TINT 26 (MISCELLANEOUS) ×3 IMPLANT
CLIP APPLIE 5 13 M/L LIGAMAX5 (MISCELLANEOUS) IMPLANT
CLIP APPLIE ROT 10 11.4 M/L (STAPLE) IMPLANT
COVER MAYO STAND STRL (DRAPES) ×3 IMPLANT
COVER SURGICAL LIGHT HANDLE (MISCELLANEOUS) ×3 IMPLANT
COVER TIP SHEARS 8 DVNC (MISCELLANEOUS) IMPLANT
COVER TIP SHEARS 8MM DA VINCI (MISCELLANEOUS)
COVER WAND RF STERILE (DRAPES) ×3 IMPLANT
DECANTER SPIKE VIAL GLASS SM (MISCELLANEOUS) ×3 IMPLANT
DERMABOND ADVANCED (GAUZE/BANDAGES/DRESSINGS) ×1
DERMABOND ADVANCED .7 DNX12 (GAUZE/BANDAGES/DRESSINGS) ×2 IMPLANT
DRAPE 3/4 80X56 (DRAPES) ×3 IMPLANT
DRAPE ARM DVNC X/XI (DISPOSABLE) ×6 IMPLANT
DRAPE COLUMN DVNC XI (DISPOSABLE) IMPLANT
DRAPE DA VINCI XI ARM (DISPOSABLE) ×3
DRAPE DA VINCI XI COLUMN (DISPOSABLE)
DRSG TEGADERM 2-3/8X2-3/4 SM (GAUZE/BANDAGES/DRESSINGS) ×3 IMPLANT
ELECT REM PT RETURN 15FT ADLT (MISCELLANEOUS) ×3 IMPLANT
ENDOLOOP SUT PDS II  0 18 (SUTURE)
ENDOLOOP SUT PDS II 0 18 (SUTURE) IMPLANT
GAUZE 4X4 16PLY RFD (DISPOSABLE) ×3 IMPLANT
GAUZE SPONGE 2X2 8PLY STRL LF (GAUZE/BANDAGES/DRESSINGS) ×2 IMPLANT
GLOVE BIO SURGEON STRL SZ7.5 (GLOVE) ×6 IMPLANT
GLOVE INDICATOR 8.0 STRL GRN (GLOVE) ×6 IMPLANT
GOWN STRL REUS W/TWL LRG LVL3 (GOWN DISPOSABLE) ×9 IMPLANT
GOWN STRL REUS W/TWL XL LVL3 (GOWN DISPOSABLE) ×6 IMPLANT
MARKER SKIN DUAL TIP RULER LAB (MISCELLANEOUS) ×3 IMPLANT
NEEDLE HYPO 22GX1.5 SAFETY (NEEDLE) ×3 IMPLANT
NEEDLE INSUFFLATION 14GA 120MM (NEEDLE) ×3 IMPLANT
PACK CARDIOVASCULAR III (CUSTOM PROCEDURE TRAY) ×3 IMPLANT
PAD POSITIONING PINK XL (MISCELLANEOUS) ×3 IMPLANT
RELOAD STAPLER 3.5X45 BLU DVNC (STAPLE) IMPLANT
RELOAD STAPLER 3.5X60 BLU DVNC (STAPLE) ×10 IMPLANT
RELOAD STAPLER 4.3X60 GRN DVNC (STAPLE) ×2 IMPLANT
SCISSORS LAP 5X35 DISP (ENDOMECHANICALS) IMPLANT
SEAL CANN UNIV 5-8 DVNC XI (MISCELLANEOUS) ×4 IMPLANT
SEAL XI 5MM-8MM UNIVERSAL (MISCELLANEOUS) ×2
SEALER VESSEL DA VINCI XI (MISCELLANEOUS) ×1
SEALER VESSEL EXT DVNC XI (MISCELLANEOUS) ×2 IMPLANT
SET IRRIG TUBING LAPAROSCOPIC (IRRIGATION / IRRIGATOR) ×3 IMPLANT
SLEEVE GASTRECTOMY 40FR VISIGI (MISCELLANEOUS) ×3 IMPLANT
SOL ANTI FOG 6CC (MISCELLANEOUS) ×2 IMPLANT
SOLUTION ANTI FOG 6CC (MISCELLANEOUS) ×1
SOLUTION ELECTROLUBE (MISCELLANEOUS) ×3 IMPLANT
SPONGE GAUZE 2X2 STER 10/PKG (GAUZE/BANDAGES/DRESSINGS) ×1
STAPLER 60 DA VINCI SURE FORM (STAPLE) ×1
STAPLER 60 SUREFORM DVNC (STAPLE) ×2 IMPLANT
STAPLER CANNULA SEAL DVNC XI (STAPLE) ×2 IMPLANT
STAPLER CANNULA SEAL XI (STAPLE) ×1
STAPLER RELOAD 3.5X45 BLU DVNC (STAPLE)
STAPLER RELOAD 3.5X45 BLUE (STAPLE)
STAPLER RELOAD 3.5X60 BLU DVNC (STAPLE) ×10
STAPLER RELOAD 3.5X60 BLUE (STAPLE) ×5
STAPLER RELOAD 4.3X60 GREEN (STAPLE) ×1
STAPLER RELOAD 4.3X60 GRN DVNC (STAPLE) ×2
STRIP CLOSURE SKIN 1/2X4 (GAUZE/BANDAGES/DRESSINGS) ×3 IMPLANT
SUT MNCRL AB 4-0 PS2 18 (SUTURE) IMPLANT
SUT VLOC 180 2-0 9IN GS21 (SUTURE) IMPLANT
SYR 20ML LL LF (SYRINGE) ×3 IMPLANT
TAPE STRIPS DRAPE STRL (GAUZE/BANDAGES/DRESSINGS) ×3 IMPLANT
TOWEL OR 17X26 10 PK STRL BLUE (TOWEL DISPOSABLE) ×3 IMPLANT
TOWEL OR NON WOVEN STRL DISP B (DISPOSABLE) ×3 IMPLANT
TRAY FOLEY MTR SLVR 16FR STAT (SET/KITS/TRAYS/PACK) IMPLANT
TROCAR ADV FIXATION 5X100MM (TROCAR) IMPLANT
TROCAR BLADELESS OPT 5 100 (ENDOMECHANICALS) ×3 IMPLANT
TUBING INSUFFLATION 10FT LAP (TUBING) ×3 IMPLANT

## 2019-12-16 NOTE — Plan of Care (Signed)
Pt walked from stretcher to bathroom, voided and sat in chair. Pt unable to demonstrate understanding of IS at this time, will reevaluate when patient is more alert.

## 2019-12-16 NOTE — Op Note (Signed)
12/16/2019 Meghan Orr 03/29/1970 [id number]   PRE-OPERATIVE DIAGNOSIS:     Severe obesity (BMI 56)   Prediabetes   Osteoarthritis of right hip   History of migraine headaches  POST-OPERATIVE DIAGNOSIS:  same  PROCEDURE:  Procedure(s): Xi robotic SLEEVE GASTRECTOMY  UPPER GI ENDOSCOPY  SURGEON:  Surgeon(s): Atilano Ina, MD FACS FASMBS  ASSISTANTS: Phylliss Blakes MD FACS  ANESTHESIA:   general  DRAINS: none   BOUGIE: 40 fr ViSiGi  LOCAL MEDICATIONS USED:   Exparel  EBL: minimal  SPECIMEN:  Source of Specimen:  Greater curvature of stomach  DISPOSITION OF SPECIMEN:  PATHOLOGY  COUNTS:  YES  INDICATION FOR PROCEDURE: This is a very pleasant 49 y.o.-year-old morbidly obese female who has had unsuccessful attempts for sustained weight loss. The patient presents today for a planned laparoscopic sleeve gastrectomy with upper endoscopy. We have discussed the risk and benefits of the procedure extensively preoperatively. Please see my separate notes.  PROCEDURE: After obtaining informed consent and receiving 5000 units of subcutaneous heparin, the patient was brought to the operating room at [facility] and placed supine on the operating room table. General endotracheal anesthesia was established. Sequential compression devices were placed. A orogastric tube was placed. The patient's abdomen was prepped and draped in the usual standard surgical fashion. The patient received preoperative IV antibiotic. A surgical timeout was performed. ERAS protocol used.   Access to the abdomen was achieved using a 5 mm 0 laparoscope thru a 5 mm trocar In the mid clavicular line in the left midabdomen about 3 cm to the left & slight above the level of the umbilicus using the Optiview technique. Pneumoperitoneum was smoothly established up to 15 mm of mercury. The laparoscope was advanced and the abdominal cavity was surveilled. The patient was then placed in reverse Trendelenburg.  A  tap block was performed on patient's right side under direct visualization.  A 12 mm robotic trocar was placed in the mid right abdomen.  The patient was rotated to the right.  The optical entry trocar in the left midabdomen was changed to a 8 mm robotic trocar under direct visualization.  The [facility] liver retractor was placed under the left lobe of the liver through a 5 mm trocar incision site in the subxiphoid position. A final 5 mm trocar was placed in the lateral LUQ.  A tap block was performed along the patient's left side also under direct laparoscopic visualization.     The XI robot was then docked and targeted for upper abdominal anatomy.  Arm 3  was attached to the left mid abdominal trocar and the camera was inserted.  Anatomy was targeted.  Arms 2 and 4 were then attached to the robotic trochars.  A fenestrated bipolar was placed in arm 2.  A vessel sealer was placed in arm 4.  My assistant stayed at the bedside while I went to the robotic console.  The stomach was inspected. It was completely decompressed and the orogastric tube was removed.  There was no anterior dimple that was obviously visible. Her preop UGI showed a potential small sliding hiatal hernia. The calibration tube was placed in the oropharynx and guided down into the stomach by the CRNA. 10 mL of air was insufflated into the calibration balloon. The calibration tubing was then gently pulled back by the CRNA and it did not slide past the GE junction. Therefore I did not think patient had a clinically significant hiatal hernia so no dissection was performed.  At this point the calibration tubing was desufflated and removed.   We identified the pylorus and measured 6 cm proximal to the pylorus and identified an area of where we would start taking down the short gastric vessels.  The vessel sealer was used to take down the short gastric vessels along the greater curvature of the stomach. We were able to enter the lesser sac. We  continued to march along the greater curvature of the stomach taking down the short gastrics.  My assistant provided traction laterally to patient's left side.  as we approached the gastrosplenic ligament we took care in this area not to injure the spleen. We were able to take down the entire gastrosplenic ligament. We then mobilized the fundus away from the left crus of diaphragm. There were not any significant posterior gastric avascular attachments. This left the stomach completely mobilized. No vessels had been taken down along the lesser curvature of the stomach.   We then reidentified the pylorus. A 40Fr ViSiGi was then placed in the oropharynx and advanced down into the stomach and placed in the distal antrum and positioned along the lesser curvature. It was placed under suction which secured the 40Fr ViSiGi in place along the lesser curve. Then using the robotic 60 mm stapler with a green load, I placed a stapler along the antrum approximately 6cm from the pylorus. The stapler was angled so that there is ample room at the angularis incisura. I then fired the first staple load after inspecting it posteriorly to ensure adequate space both anteriorly and posteriorly.  The stapler did not have to pause for compression so at this point I started using 60 mm blue load staple cartridges. The robotic stapler was then repositioned with a 60 mm blue load  and we continued to march up along the ViSiGi. My assistant was holding traction along the greater curvature stomach along the cauterized short gastric vessels ensuring that the stomach was symmetrically retracted. Prior to each firing of the staple, we rotated the stomach to ensure that there is adequate stomach left.  As we approached the fundus, I used 60 mm blue cartridge aiming  lateral to the GE junction after mobilizing some of the esophageal fat pad.  The sleeve was inspected. There is no evidence of cork screw. The staple line appeared hemostatic.   The  CRNA inflated the ViSiGi to the green zone and the upper abdomen was flooded with saline. There were no bubbles. The sleeve was decompressed and the ViSiGi removed.I performed an upper endoscopy.  The endoscope was placed in the patient's oropharynx and gently glided down.  Z-line at approximately 38 cm.  The endoscope was advanced into the gastric sleeve and insufflated.  I was able to easily advance the endoscope down into the antrum.  Pylorus was visible.  There is no stricture at the incisura.  There was no corkscrewing.  There is no evidence of internal bleeding.  The sleeve was decompressed and the endoscope was removed. Sleeve staple line reinspected and again no evidence of bleeding.   The greater curvature the stomach was grasped with a laparoscopic grasper.  At this point I scrubbed back in.  The robot was undocked.  We removed the specimen from the 12 mm trocar site.  The liver retractor was removed. I then closed the 12 mm trocar site with 1 interrupted 0 Vicryl sutures through the fascia using the endoclose. The closure was viewed laparoscopically and it was airtight. Remaining Exparel was then infiltrated  in the preperitoneal spaces around the trocar sites. Pneumoperitoneum was released. All trocar sites were closed with a 4-0 Monocryl in a subcuticular fashion followed by the application of steri-strips, gauze and tegaderm. The patient was extubated and taken to the recovery room in stable condition. All needle, instrument, and sponge counts were correct x2. There are no immediate complications  (1) 60 mm green  (5) 60 mm blue   PLAN OF CARE: Admit to inpatient   PATIENT DISPOSITION:  PACU - hemodynamically stable.   Delay start of Pharmacological VTE agent (>24hrs) due to surgical blood loss or risk of bleeding:  no  Mary Sella. Andrey Campanile, MD, FACS FASMBS General, Bariatric, & Minimally Invasive Surgery [facility], [place]

## 2019-12-16 NOTE — Transfer of Care (Signed)
Immediate Anesthesia Transfer of Care Note  Patient: Meghan Orr  Procedure(s) Performed: XI ROBOTIC GASTRIC SLEEVE RESECTION (N/A Abdomen) UPPER GI ENDOSCOPY (N/A )  Patient Location: PACU  Anesthesia Type:General  Level of Consciousness: awake, drowsy and patient cooperative  Airway & Oxygen Therapy: Patient Spontanous Breathing and Patient connected to face mask oxygen  Post-op Assessment: Report given to RN and Post -op Vital signs reviewed and stable  Post vital signs: Reviewed and stable  Last Vitals:  Vitals Value Taken Time  BP 168/77 12/16/19 1425  Temp    Pulse 98 12/16/19 1427  Resp 18 12/16/19 1427  SpO2 99 % 12/16/19 1427  Vitals shown include unvalidated device data.  Last Pain:  Vitals:   12/16/19 1118  TempSrc:   PainSc: 6       Patients Stated Pain Goal: 5 (12/16/19 1118)  Complications: No complications documented.

## 2019-12-16 NOTE — Anesthesia Postprocedure Evaluation (Addendum)
Anesthesia Post Note  Patient: Meghan Orr  Procedure(s) Performed: XI ROBOTIC GASTRIC SLEEVE RESECTION (N/A Abdomen) UPPER GI ENDOSCOPY (N/A )     Patient location during evaluation: PACU Anesthesia Type: General Level of consciousness: awake and alert and oriented Pain management: pain level controlled Vital Signs Assessment: post-procedure vital signs reviewed and stable Respiratory status: spontaneous breathing, nonlabored ventilation and respiratory function stable Cardiovascular status: blood pressure returned to baseline and stable Postop Assessment: no apparent nausea or vomiting Anesthetic complications: no Comments: She had some substernal chest pain in PACU. 12 lead EKG done which showed no acute changes. I feel her chest pain is probably related to insufflated CO2 during the case and possible pneumomediastinum which should resolve over time.   No complications documented.  Last Vitals:  Vitals:   12/16/19 1500 12/16/19 1515  BP: (!) 145/71 (!) 144/84  Pulse: 93 92  Resp: 19 16  Temp:  36.7 C  SpO2: 97% 95%    Last Pain:  Vitals:   12/16/19 1515  TempSrc:   PainSc: Asleep                 Zeshan Sena A.

## 2019-12-16 NOTE — H&P (Signed)
CC: here for surgery  Requesting provider: n/a  HPI: Meghan Orr is an 49 y.o. female who is here for robotic/lap sleeve gastrectomy. Denies any medical changes since seen in clinic other than going to UC the other week for low back pain- mainly right buttock. No sciatica. Got toradol and no steroids  History - The patient is a 49 year old female who presents for a bariatric surgery evaluation. She comes in for follow-up regarding her severe obesity. I initially met her in January of this year. She has completed her bariatric surgery evaluation. She is received clearance from the psychologist. She denies any medical changes since she was last seen. She did have a steroid injection her left foot at the beginning of July. Otherwise no trips to the emergency room hospital. She denies any new medications. She is still having ankle issues and is scheduled to get an MRI. She denies any chest pain, chest pressure, source of breath, dyspnea on exertion, orthopnea. She underwent an upper GI which showed a small hiatal hernia without reflux or esophageal dysmotility. Her lab work was unremarkable except for an A1c of 6. No tobacco use.    02/17/19 She is referred by Dr Venora Maples at the [facility] in [place]. She completed our seminar online. She is initially interested in the laparoscopic adjustable gastric band because she is interested in the least invasive procedure. However she is interested in hearing about other options. She is interested in being more healthy and being able to move around easier.  Despite numerous attempts for sustained weight loss she has been unsuccessful. She has tried low calorie diets, ketogenic diet as well as phentermine-all without any long-term success. She was able to lose 40 pounds with a ketogenic diet but regained the weight.  Her comorbidities include right hip osteoarthritis, prediabetes, and migraines, food related heartburn  She denies any  chest pain, chest pressure, shortness of breath, orthopnea, paroxysmal nocturnal dyspnea, dyspnea on exertion, TIAs or amaurosis fugax. She denies any personal family history of blood clots. She denies a diagnosis of hyperlipidemia. She will occasionally have lower extremity edema around her ankles. She states that she had a negative sleep study in September 2020. She states that she does have some heartburn and reflux but is generally food related. She may have to take Tums a few times a month. She has a daily bowel movement. 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 Denies any melena hematochezia. She denies any dysuria.  She has hip pain on the right side as well as left ankle pain. She is received a steroid injection for her right hip. She has chronic migraines. She will have several attacks per month.  She denies any tobacco or drugs. She may have alcohol a few times a month.  I reviewed an ER note from November 2020  Past Medical History:  Diagnosis Date  . Anxiety   . Arthritis   . Depression   . Headache   . Pre-diabetes     Past Surgical History:  Procedure Laterality Date  . HEMICOLECTOMY Right 2002  . SHOULDER ARTHROSCOPY Right     Family History  Problem Relation Age of Onset  . Diabetes Father   . Heart attack Father   . Meniere's disease Sister     Social:  reports that she quit smoking about 4 years ago. Her smoking use included cigarettes. She has never used smokeless tobacco. She reports current alcohol use. She reports  that she does not use drugs.  Allergies:  Allergies  Allergen Reactions  . Eggs Or Egg-Derived Products Hives and Swelling    Facial Swelling  . Morphine Hives  . Sulfa Antibiotics Nausea And Vomiting    Medications: I have reviewed the patient's current medications.   ROS - all of the below systems have been reviewed with the patient and positives are indicated with bold text General:  chills, fever or night sweats Eyes: blurry vision or double vision ENT: epistaxis or sore throat Allergy/Immunology: itchy/watery eyes or nasal congestion Hematologic/Lymphatic: bleeding problems, blood clots or swollen lymph nodes Endocrine: temperature intolerance or unexpected weight changes Breast: new or changing breast lumps or nipple discharge Resp: cough, shortness of breath, or wheezing CV: chest pain or dyspnea on exertion GI: as per HPI GU: dysuria, trouble voiding, or hematuria MSK: joint pain or joint stiffness; right buttock pain Neuro: TIA or stroke symptoms Derm: pruritus and skin lesion changes Psych: anxiety and depression  PE Blood pressure (!) 177/80, pulse 90, temperature 98 F (36.7 C), temperature source Oral, resp. rate 19, height 5' 7" (1.702 m), weight (!) 162.2 kg, SpO2 100 %. Constitutional: NAD; conversant; no deformities; severe obesity Eyes: Moist conjunctiva; no lid lag; anicteric; PERRL Neck: Trachea midline; no thyromegaly Lungs: Normal respiratory effort; no tactile fremitus CV: RRR; no palpable thrills; no pitting edema GI: Abd soft, nt, nd; no palpable hepatosplenomegaly MSK: Normal gait; no clubbing/cyanosis Psychiatric: Appropriate affect; alert and oriented x3 Lymphatic: No palpable cervical or axillary lymphadenopathy Skin:no rash/edema  Results for orders placed or performed during the hospital encounter of 12/16/19 (from the past 48 hour(s))  Pregnancy, urine STAT morning of surgery     Status: None   Collection Time: 12/16/19 10:49 AM  Result Value Ref Range   Preg Test, Ur NEGATIVE NEGATIVE    Comment:        THE SENSITIVITY OF THIS METHODOLOGY IS >20 mIU/mL. Performed at [facility], [address] [place], [place] [address]   ABO/Rh     Status: None   Collection Time: 12/16/19 11:40 AM  Result Value Ref Range   ABO/RH(D)      A POS Performed at [facility], [address]  [place], [place] [address]     No results found.  Imaging: reviewed  A/P: Meghan Orr is an 49 y.o. female with  Severe obesity Right hip OA Chronic migraine Prediabetes Poss small hiatal hernia  To OR for robotic sleeve gastrectomy, upper egb; poss HHR  IV abx ERAS subcu heparin preop   Discussed use of surgical robot  All questions asked and answered  Eric M. Wilson, MD, FACS General, Bariatric, & Minimally Invasive Surgery [facility]   

## 2019-12-16 NOTE — Progress Notes (Signed)
Discussed post op day goals with patient including ambulation, IS, diet progression, pain, and nausea control.  BSTOP education provided including BSTOP information guide, "Guide for Pain Management after your Bariatric Procedure".  Questions answered. 

## 2019-12-16 NOTE — Anesthesia Preprocedure Evaluation (Signed)
Anesthesia Evaluation  Patient identified by MRN, date of birth, ID band Patient awake    Reviewed: Allergy & Precautions, NPO status , Patient's Chart, lab work & pertinent test results, reviewed documented beta blocker date and time   Airway Mallampati: II  TM Distance: >3 FB Neck ROM: Full    Dental no notable dental hx. (+) Teeth Intact   Pulmonary former smoker,    Pulmonary exam normal breath sounds clear to auscultation       Cardiovascular hypertension, Pt. on medications Normal cardiovascular exam Rhythm:Regular Rate:Normal  EKG 05/06/19 NSR, Poor r wave progression   Neuro/Psych  Headaches, PSYCHIATRIC DISORDERS Anxiety Depression    GI/Hepatic Neg liver ROS, hiatal hernia,   Endo/Other  Morbid obesityPre Diabetes  Renal/GU negative Renal ROS  negative genitourinary   Musculoskeletal  (+) Arthritis , Osteoarthritis,    Abdominal (+) + obese,   Peds  Hematology negative hematology ROS (+)   Anesthesia Other Findings   Reproductive/Obstetrics (+) Pregnancy                             Anesthesia Physical Anesthesia Plan  ASA: III  Anesthesia Plan: General   Post-op Pain Management:    Induction: Intravenous, Rapid sequence and Cricoid pressure planned  PONV Risk Score and Plan: 4 or greater and Scopolamine patch - Pre-op, Ondansetron, Midazolam and Treatment may vary due to age or medical condition  Airway Management Planned: Oral ETT  Additional Equipment:   Intra-op Plan:   Post-operative Plan: Extubation in OR  Informed Consent: I have reviewed the patients History and Physical, chart, labs and discussed the procedure including the risks, benefits and alternatives for the proposed anesthesia with the patient or authorized representative who has indicated his/her understanding and acceptance.     Dental advisory given  Plan Discussed with: CRNA and  Anesthesiologist  Anesthesia Plan Comments:         Anesthesia Quick Evaluation

## 2019-12-16 NOTE — Progress Notes (Signed)
PHARMACY CONSULT FOR:  Risk Assessment for Post-Discharge VTE Following Bariatric Surgery  Post-Discharge VTE Risk Assessment: This patient's probability of 30-day post-discharge VTE is increased due to the factors marked:   Female    Age >/=60 years  x  BMI >/=50 kg/m2    CHF    Dyspnea at Rest    Paraplegia  x  Non-gastric-band surgery    Operation Time >/=3 hr    Return to OR     Length of Stay >/= 3 d   Hx of VTE   Hypercoagulable condition   Significant venous stasis       Predicted probability of 30-day post-discharge VTE: 0.27%  Other patient-specific factors to consider:   Recommendation for Discharge: No pharmacologic prophylaxis post-discharge    Meghan Orr is a 49 y.o. female who underwent laparoscopic sleeve gastrectomy on 11/22   Case start: 1303 Case end: 1413   Allergies  Allergen Reactions  . Eggs Or Egg-Derived Products Hives and Swelling    Facial Swelling  . Morphine Hives  . Sulfa Antibiotics Nausea And Vomiting    Patient Measurements: Height: 5\' 7"  (170.2 cm) Weight: (!) 162.2 kg (357 lb 9.6 oz) IBW/kg (Calculated) : 61.6 Body mass index is 56.01 kg/m.  No results for input(s): WBC, HGB, HCT, PLT, APTT, CREATININE, LABCREA, CREATININE, CREAT24HRUR, MG, PHOS, ALBUMIN, PROT, ALBUMIN, AST, ALT, ALKPHOS, BILITOT, BILIDIR, IBILI in the last 72 hours. Estimated Creatinine Clearance: 139.7 mL/min (by C-G formula based on SCr of 0.71 mg/dL).    Past Medical History:  Diagnosis Date  . Anxiety   . Arthritis   . Depression   . Headache   . Pre-diabetes      Medications Prior to Admission  Medication Sig Dispense Refill Last Dose  . acetaminophen (TYLENOL) 500 MG tablet Take 1,000 mg by mouth as needed for moderate pain.    Past Week at Unknown time  . APPLE CIDER VINEGAR PO Take 1 tablet by mouth daily.   Past Week at Unknown time  . ASHWAGANDHA PO Take 1 tablet by mouth daily.   Past Week at Unknown time  . cyclobenzaprine  (FLEXERIL) 10 MG tablet Take 10 mg by mouth 3 (three) times daily as needed for muscle spasms.   Past Week at Unknown time  . diclofenac Sodium (VOLTAREN) 1 % GEL Apply 2 g topically 3 (three) times daily as needed (back pain).   Past Week at Unknown time  . hydrOXYzine (ATARAX/VISTARIL) 10 MG tablet Take 10 mg by mouth 3 (three) times daily as needed for anxiety.    Past Week at Unknown time  . lidocaine (LIDODERM) 5 % Place 1 patch onto the skin daily.   12/12/2019  . Multiple Vitamin (MULTIVITAMIN WITH MINERALS) TABS tablet Take 1 tablet by mouth daily.   Past Week at Unknown time  . prazosin (MINIPRESS) 2 MG capsule Take 2 mg by mouth at bedtime.   Past Week at Unknown time  . topiramate (TOPAMAX) 200 MG tablet Take 200 mg by mouth 2 (two) times daily.    Past Week at Unknown time  . traZODone (DESYREL) 100 MG tablet Take 100 mg by mouth at bedtime.    Past Month at Unknown time  . naproxen (NAPROSYN) 500 MG tablet Take 1 tablet (500 mg total) by mouth 2 (two) times daily as needed for mild pain, moderate pain or headache (TAKE WITH MEALS.). (Patient not taking: Reported on 12/05/2019) 20 tablet 0 Not Taking at Unknown time  .  VITAMIN D, CHOLECALCIFEROL, PO Take by mouth. (Patient not taking: Reported on 12/05/2019)   Not Taking at Unknown time       Berkley Harvey 12/16/2019,4:21 PM

## 2019-12-16 NOTE — Discharge Instructions (Signed)
   GASTRIC BYPASS/SLEEVE  Home Care Instructions   These instructions are to help you care for yourself when you go home.  Call: If you have any problems. . Call [phone] and ask for the surgeon on call . If you need immediate help, come to the ER at [facility].  . Tell the ER staff that you are a new post-op gastric bypass or gastric sleeve patient   Signs and symptoms to report: . Severe vomiting or nausea o If you cannot keep down clear liquids for longer than 1 day, call your surgeon  . Abdominal pain that does not get better after taking your pain medication . Fever over 100.4 F with chills . Heart beating over 100 beats a minute . Shortness of breath at rest . Chest pain .  Redness, swelling, drainage, or foul odor at incision (surgical) sites .  If your incisions open or pull apart . Swelling or pain in calf (lower leg) . Diarrhea (Loose bowel movements that happen often), frequent watery, uncontrolled bowel movements . Constipation, (no bowel movements for 3 days) if this happens: Pick one o Milk of Magnesia, 2 tablespoons by mouth, 3 times a day for 2 days if needed o Stop taking Milk of Magnesia once you have a bowel movement o Call your doctor if constipation continues Or o Miralax  (instead of Milk of Magnesia) following the label instructions o Stop taking Miralax once you have a bowel movement o Call your doctor if constipation continues . Anything you think is not normal   Normal side effects after surgery: . Unable to sleep at night or unable to focus . Irritability or moody . Being tearful (crying) or depressed These are common complaints, possibly related to your anesthesia medications that put you to sleep, stress of surgery, and change in lifestyle.  This usually goes away a few weeks after surgery.  If these feelings continue, call your primary care doctor.   Wound Care: You may have surgical glue, steri-strips, or staples over your incisions after  surgery . Surgical glue:  Looks like a clear film over your incisions and will wear off a little at a time . Steri-strips: Strips of tape over your incisions. You may notice a yellowish color on the skin under the steri-strips. This is used to make the   steri-strips stick better. Do not pull the steri-strips off - let them fall off . Staples: Staples may be removed before you leave the hospital o If you go home with staples, call [facility], [phone] at for an appointment with your surgeon's nurse to have staples removed 10 days after surgery. . Showering: You may shower two (2) days after your surgery unless your surgeon tells you differently o Wash gently around incisions with warm soapy water, rinse well, and gently pat dry  o No tub baths until staples are removed, steri-strips fall off or glue is gone.    Medications: . Medications should be liquid or crushed if larger than the size of a dime . Extended release pills (medication that release a little bit at a time through the day) should NOT be crushed or cut. (examples include XL, ER, DR, SR) . Depending on the size and number of medications you take, you may need to space (take a few throughout the day)/change the time you take your medications so that you do not over-fill your pouch (smaller stomach) . Make sure you follow-up with your primary care doctor to   make medication changes needed during rapid weight loss and life-style changes . If you have diabetes, follow up with the doctor that orders your diabetes medication(s) within one week after surgery and check your blood sugar regularly. . Do not drive while taking prescription pain medication  . It is ok to take Tylenol by the bottle instructions with your pain medicine or instead of your pain medicine as needed.  DO NOT TAKE NSAIDS (EXAMPLES OF NSAIDS:  IBUPROFREN/ NAPROXEN)  Diet:                    First 2 Weeks  You will see the dietician t about two (2) weeks  after your surgery. The dietician will increase the types of foods you can eat if you are handling liquids well: . If you have severe vomiting or nausea and cannot keep down clear liquids lasting longer than 1 day, call your surgeon @ ([phone]) Protein Shake . Drink at least 2 ounces of shake 5-6 times per day . Each serving of protein shakes (usually 8 - 12 ounces) should have: o 15 grams of protein  o And no more than 5 grams of carbohydrate  . Goal for protein each day: o Men = 80 grams per day o Women = 60 grams per day . Protein powder may be added to fluids such as non-fat milk or Lactaid milk or unsweetened Soy/Almond milk (limit to 35 grams added protein powder per serving)  Hydration . Slowly increase the amount of water and other clear liquids as tolerated (See Acceptable Fluids) . Slowly increase the amount of protein shake as tolerated  .  Sip fluids slowly and throughout the day.  Do not use straws. . May use sugar substitutes in small amounts (no more than 6 - 8 packets per day; i.e. Splenda)  Fluid Goal . The first goal is to drink at least 8 ounces of protein shake/drink per day (or as directed by the nutritionist); some examples of protein shakes are Syntrax Nectar, Adkins Advantage, EAS Edge HP, and Unjury. See handout from pre-op Bariatric Education Class: o Slowly increase the amount of protein shake you drink as tolerated o You may find it easier to slowly sip shakes throughout the day o It is important to get your proteins in first . Your fluid goal is to drink 64 - 100 ounces of fluid daily o It may take a few weeks to build up to this . 32 oz (or more) should be clear liquids  And  . 32 oz (or more) should be full liquids (see below for examples) . Liquids should not contain sugar, caffeine, or carbonation  Clear Liquids: . Water or Sugar-free flavored water (i.e. Fruit H2O, Propel) . Decaffeinated coffee or tea (sugar-free) . Crystal Lite, Wyler's Lite,  Minute Maid Lite . Sugar-free Jell-O . Bouillon or broth . Sugar-free Popsicle:   *Less than 20 calories each; Limit 1 per day  Full Liquids: Protein Shakes/Drinks + 2 choices per day of other full liquids . Full liquids must be: o No More Than 15 grams of Carbs per serving  o No More Than 3 grams of Fat per serving . Strained low-fat cream soup (except Cream of Potato or Tomato) . Non-Fat milk . Fat-free Lactaid Milk . Unsweetened Soy Or Unsweetened Almond Milk . Low Sugar yogurt (Dannon Lite & Fit, Greek yogurt; Oikos Triple Zero; Chobani Simply 100; Yoplait 100 calorie Greek - No Fruit on the Bottom)    Vitamins   and Minerals . Start 1 day after surgery unless otherwise directed by your surgeon . Chewable Bariatric Specific Multivitamin / Multimineral Supplement with iron (Example: Bariatric Advantage Multi EA) . Chewable Calcium with Vitamin D-3 (Example: 3 Chewable Calcium Plus 600 with Vitamin D-3) o Take 500 mg three (3) times a day for a total of 1500 mg each day o Do not take all 3 doses of calcium at one time as it may cause constipation, and you can only absorb 500 mg  at a time  o Do not mix multivitamins containing iron with calcium supplements; take 2 hours apart . Menstruating women and those with a history of anemia (a blood disease that causes weakness) may need extra iron o Talk with your doctor to see if you need more iron . Do not stop taking or change any vitamins or minerals until you talk to your dietitian or surgeon . Your Dietitian and/or surgeon must approve all vitamin and mineral supplements   Activity and Exercise: Limit your physical activity as instructed by your doctor.  It is important to continue walking at home.  During this time, use these guidelines: . Do not lift anything greater than ten (10) pounds for at least two (2) weeks . Do not go back to work or drive until your surgeon says you can . You may have sex when you feel comfortable  o It is  VERY important for female patients to use a reliable birth control method; fertility often increases after surgery  o All hormonal birth control will be ineffective for 30 days after surgery due to medications given during surgery a barrier method must be used. o Do not get pregnant for at least 18 months . Start exercising as soon as your doctor tells you that you can o Make sure your doctor approves any physical activity . Start with a simple walking program . Walk 5-15 minutes each day, 7 days per week.  . Slowly increase until you are walking 30-45 minutes per day Consider joining our BELT program. [phone] or email [email or web address]   Special Instructions Things to remember: . Use your CPAP when sleeping if this applies to you  . [facility] has two free Bariatric Surgery Support Groups that meet monthly o The 3rd Thursday of each month, 6 pm, [facility] . It is very important to keep all follow up appointments with your surgeon, dietitian, primary care physician, and behavioral health practitioner . Routine follow up schedule with your surgeon include appointments at 2-3 weeks, 6-8 weeks, 6 months, and 1 year at a minimum.  Your surgeon may request to see you more often.   . After the first year, please follow up with your bariatric surgeon and dietitian at least once a year in order to maintain best weight loss results   [facility]: [phone] [facility]: [phone] Bariatric Nurse Coordinator: [phone]      Reviewed and Endorsed  by [facility] Patient Education Committee, June, 2016 Edits Approved: Aug, 2018    

## 2019-12-16 NOTE — Anesthesia Procedure Notes (Signed)
Procedure Name: Intubation Date/Time: 12/16/2019 12:45 PM Performed by: West Pugh, CRNA Pre-anesthesia Checklist: Patient identified, Emergency Drugs available, Suction available, Patient being monitored and Timeout performed Patient Re-evaluated:Patient Re-evaluated prior to induction Oxygen Delivery Method: Circle system utilized Preoxygenation: Pre-oxygenation with 100% oxygen Induction Type: IV induction, Rapid sequence and Cricoid Pressure applied Laryngoscope Size: Mac and 4 Grade View: Grade II Tube type: Oral Tube size: 7.5 mm Number of attempts: 1 Airway Equipment and Method: Stylet Placement Confirmation: ETT inserted through vocal cords under direct vision,  positive ETCO2 and breath sounds checked- equal and bilateral Secured at: 22 cm Tube secured with: Tape Dental Injury: Teeth and Oropharynx as per pre-operative assessment

## 2019-12-17 ENCOUNTER — Encounter (HOSPITAL_COMMUNITY): Payer: Self-pay | Admitting: General Surgery

## 2019-12-17 LAB — CBC WITH DIFFERENTIAL/PLATELET
Abs Immature Granulocytes: 0.01 K/uL (ref 0.00–0.07)
Basophils Absolute: 0 K/uL (ref 0.0–0.1)
Basophils Relative: 0 %
Eosinophils Absolute: 0 K/uL (ref 0.0–0.5)
Eosinophils Relative: 0 %
HCT: 40.7 % (ref 36.0–46.0)
Hemoglobin: 13.5 g/dL (ref 12.0–15.0)
Immature Granulocytes: 0 %
Lymphocytes Relative: 16 %
Lymphs Abs: 0.8 K/uL (ref 0.7–4.0)
MCH: 26.6 pg (ref 26.0–34.0)
MCHC: 33.2 g/dL (ref 30.0–36.0)
MCV: 80.3 fL (ref 80.0–100.0)
Monocytes Absolute: 0.3 K/uL (ref 0.1–1.0)
Monocytes Relative: 7 %
Neutro Abs: 3.8 K/uL (ref 1.7–7.7)
Neutrophils Relative %: 77 %
Platelets: 299 K/uL (ref 150–400)
RBC: 5.07 MIL/uL (ref 3.87–5.11)
RDW: 14.6 % (ref 11.5–15.5)
WBC: 4.9 K/uL (ref 4.0–10.5)
nRBC: 0 % (ref 0.0–0.2)

## 2019-12-17 LAB — GLUCOSE, CAPILLARY
Glucose-Capillary: 106 mg/dL — ABNORMAL HIGH (ref 70–99)
Glucose-Capillary: 127 mg/dL — ABNORMAL HIGH (ref 70–99)
Glucose-Capillary: 133 mg/dL — ABNORMAL HIGH (ref 70–99)

## 2019-12-17 LAB — COMPREHENSIVE METABOLIC PANEL
ALT: 32 U/L (ref 0–44)
AST: 34 U/L (ref 15–41)
Albumin: 4.2 g/dL (ref 3.5–5.0)
Alkaline Phosphatase: 65 U/L (ref 38–126)
Anion gap: 9 (ref 5–15)
BUN: 9 mg/dL (ref 6–20)
CO2: 21 mmol/L — ABNORMAL LOW (ref 22–32)
Calcium: 9 mg/dL (ref 8.9–10.3)
Chloride: 110 mmol/L (ref 98–111)
Creatinine, Ser: 0.62 mg/dL (ref 0.44–1.00)
GFR, Estimated: >60 mL/min (ref >60)
Glucose, Bld: 127 mg/dL — ABNORMAL HIGH (ref 70–99)
Potassium: 4.2 mmol/L (ref 3.5–5.1)
Sodium: 140 mmol/L (ref 135–145)
Total Bilirubin: 0.6 mg/dL (ref 0.3–1.2)
Total Protein: 7.7 g/dL (ref 6.5–8.1)

## 2019-12-17 MED ORDER — PANTOPRAZOLE SODIUM 40 MG PO TBEC: 40.0000 mg | DELAYED_RELEASE_TABLET | Freq: Every day | ORAL | 0 refills | Status: DC

## 2019-12-17 MED ORDER — ONDANSETRON 4 MG PO TBDP: 4.0000 mg | ORAL_TABLET | Freq: Four times a day (QID) | ORAL | 0 refills | Status: DC | PRN

## 2019-12-17 MED ORDER — OXYCODONE HCL 5 MG PO TABS: 5.0000 mg | ORAL_TABLET | Freq: Four times a day (QID) | ORAL | 0 refills | Status: DC | PRN

## 2019-12-17 MED ORDER — ACETAMINOPHEN 500 MG PO TABS: 1000.0000 mg | ORAL_TABLET | Freq: Three times a day (TID) | ORAL | 0 refills | Status: AC

## 2019-12-17 NOTE — Progress Notes (Signed)
Patient alert and oriented, pain is controlled. Patient is tolerating fluids, advanced to protein shake today, patient is tolerating well.  Reviewed Gastric sleeve discharge instructions with patient and patient is able to articulate understanding.  Provided information on BELT program, Support Group and [facility] outpatient pharmacy. All questions answered, will continue to monitor.  

## 2019-12-17 NOTE — Progress Notes (Signed)
Patient alert and oriented, Post op day 1.  Provided support and encouragement.  Encouraged pulmonary toilet, ambulation and small sips of liquids.  Completed 12 ounces of bari clear fluid and 11 ounces of protein shake.   All questions answered.  Will continue to monitor.

## 2019-12-17 NOTE — Progress Notes (Signed)
Nutrition Note  RD consulted for diet education for patient s/p bariatric surgery. Bariatric nurse coordinator providing education.  If nutrition issues arise, please consult RD.   Krithik Mapel, MS, RD, LDN Inpatient Clinical Dietitian Contact information available via Amion  

## 2019-12-17 NOTE — Discharge Summary (Signed)
Physician Discharge Summary  Meghan Orr TOI:[id number] DOB: [birth year] DOA: 12/16/2019  PCP: [facility] date: 12/16/2019 Discharge date: 12/17/2019  Recommendations for Outpatient Follow-up:    Follow-up Information    Greer Pickerel, MD. Go on 01/15/2020.   Specialty: General Surgery Why: at 2 pm.  Please arrive 15 minutes prior to appointment.  Thank you Contact information: [address] [place] [address] [phone]        Carlena Hurl, PA-C. Go on 02/14/2020.   Specialty: General Surgery Why: at 830 am. Please arrive 15 minutes prior to appointment.  Thank you Contact information: [place] [address] [phone]              Discharge Diagnoses:  Principal Problem:   Severe obesity ([place]) Active Problems:   Prediabetes   Osteoarthritis of right hip   History of migraine headaches   S/P laparoscopic/robotic sleeve gastrectomy   Surgical Procedure: Laparoscopic/robotic Sleeve Gastrectomy, upper endoscopy  Discharge Condition: Good Disposition: Home  Diet recommendation: Postoperative sleeve gastrectomy diet (liquids only)  Filed Weights   12/16/19 1058  Weight: (!) 162.2 kg     Hospital Course:  The patient was admitted for a planned laparoscopic/robotic sleeve gastrectomy. Please see operative note. Preoperatively the patient was given 5000 units of subcutaneous heparin for DVT prophylaxis. Postoperative prophylactic Lovenox dosing was started on the evening of postoperative day 0. ERAS protocol was used. On the evening of postoperative day 0, the patient was started on water and ice chips. On postoperative day 1 the patient had no fever or tachycardia and was tolerating water in their diet was gradually advanced throughout the day. The patient was ambulating without difficulty. Their vital signs are stable without fever or tachycardia. Their hemoglobin had remained stable.  The patient had  received discharge instructions and counseling. They were deemed stable for discharge and had met discharge criteria  BP (!) 150/58 (BP Location: Left Arm)   Pulse 79   Temp 98.2 F (36.8 C)   Resp 18   Ht $R'5\' 7"'iB$  (1.702 m)   Wt (!) 162.2 kg   SpO2 100%   BMI 56.01 kg/m   Gen: alert, NAD, non-toxic appearing Pupils: equal, no scleral icterus Pulm: Lungs clear to auscultation, symmetric chest rise CV: regular rate and rhythm Abd: soft, mild approp tender, nondistended. No cellulitis. No incisional hernia Ext: no edema, no calf tenderness Skin: no rash, no jaundice   Discharge Instructions  Discharge Instructions    Ambulate hourly while awake   Complete by: As directed    Call MD for:  difficulty breathing, headache or visual disturbances   Complete by: As directed    Call MD for:  persistant dizziness or light-headedness   Complete by: As directed    Call MD for:  persistant nausea and vomiting   Complete by: As directed    Call MD for:  redness, tenderness, or signs of infection (pain, swelling, redness, odor or green/yellow discharge around incision site)   Complete by: As directed    Call MD for:  severe uncontrolled pain   Complete by: As directed    Call MD for:  temperature >101 F   Complete by: As directed    Diet bariatric full liquid   Complete by: As directed    Discharge instructions   Complete by: As directed    See bariatric discharge instructions   Incentive spirometry   Complete by: As directed  Perform hourly while awake     Allergies as of 12/17/2019      Reactions   Eggs Or Egg-derived Products Hives, Swelling   Facial Swelling   Morphine Hives   Sulfa Antibiotics Nausea And Vomiting      Medication List    STOP taking these medications   naproxen 500 MG tablet Commonly known as: NAPROSYN     TAKE these medications   acetaminophen 500 MG tablet Commonly known as: TYLENOL Take 2 tablets (1,000 mg total) by mouth every 8 (eight) hours  for 5 days. What changed:   when to take this  reasons to take this   APPLE CIDER VINEGAR PO Take 1 tablet by mouth daily.   ASHWAGANDHA PO Take 1 tablet by mouth daily.   cyclobenzaprine 10 MG tablet Commonly known as: FLEXERIL Take 10 mg by mouth 3 (three) times daily as needed for muscle spasms.   diclofenac Sodium 1 % Gel Commonly known as: VOLTAREN Apply 2 g topically 3 (three) times daily as needed (back pain).   hydrOXYzine 10 MG tablet Commonly known as: ATARAX/VISTARIL Take 10 mg by mouth 3 (three) times daily as needed for anxiety.   lidocaine 5 % Commonly known as: LIDODERM Place 1 patch onto the skin daily.   multivitamin with minerals Tabs tablet Take 1 tablet by mouth daily.   ondansetron 4 MG disintegrating tablet Commonly known as: ZOFRAN-ODT Take 1 tablet (4 mg total) by mouth every 6 (six) hours as needed for nausea or vomiting.   oxyCODONE 5 MG immediate release tablet Commonly known as: Oxy IR/ROXICODONE Take 1 tablet (5 mg total) by mouth every 6 (six) hours as needed for severe pain.   pantoprazole 40 MG tablet Commonly known as: PROTONIX Take 1 tablet (40 mg total) by mouth daily.   prazosin 2 MG capsule Commonly known as: MINIPRESS Take 2 mg by mouth at bedtime.   Topamax 200 MG tablet Generic drug: topiramate Take 200 mg by mouth 2 (two) times daily.   traZODone 100 MG tablet Commonly known as: DESYREL Take 100 mg by mouth at bedtime.   VITAMIN D (CHOLECALCIFEROL) PO Take by mouth.       Follow-up Information    Gaynelle Adu, MD. Go on 01/15/2020.   Specialty: General Surgery Why: at 2 pm.  Please arrive 15 minutes prior to appointment.  Thank you Contact information: [address] [place] [address] [phone]        Hedda Slade, PA-C. Go on 02/14/2020.   Specialty: General Surgery Why: at 830 am. Please arrive 15 minutes prior to appointment.  Thank you Contact information: [address] [place] [address] [phone]                The results of significant diagnostics from this hospitalization (including imaging, microbiology, ancillary and laboratory) are listed below for reference.    Significant Diagnostic Studies: No results found.  Labs: Basic Metabolic Panel: Recent Labs  Lab 12/17/19 0513  NA 140  K 4.2  CL 110  CO2 21*  GLUCOSE 127*  BUN 9  CREATININE 0.62  CALCIUM 9.0   Liver Function Tests: Recent Labs  Lab 12/17/19 0513  AST 34  ALT 32  ALKPHOS 65  BILITOT 0.6  PROT 7.7  ALBUMIN 4.2    CBC: Recent Labs  Lab 12/16/19 1736 12/17/19 0513  WBC  --  4.9  NEUTROABS  --  3.8  HGB 13.8 13.5  HCT 42.0 40.7  MCV  --  80.3  PLT  --  299    CBG: Recent Labs  Lab 12/16/19 1647 12/16/19 2017 12/17/19 0013 12/17/19 0402 12/17/19 0753  GLUCAP 144* 130* 127* 133* 106*    Principal Problem:   Severe obesity ([place]) Active Problems:   Prediabetes   Osteoarthritis of right hip   History of migraine headaches   S/P laparoscopic sleeve gastrectomy   Time coordinating discharge: 15 min  Signed:  Gayland Curry, MD [facility], [place] 12/17/2019, 10:06 AM

## 2019-12-18 LAB — SURGICAL PATHOLOGY

## 2019-12-20 ENCOUNTER — Other Ambulatory Visit (HOSPITAL_COMMUNITY): Payer: Federal, State, Local not specified - PPO

## 2019-12-23 ENCOUNTER — Telehealth (HOSPITAL_COMMUNITY): Payer: Self-pay

## 2019-12-23 NOTE — Telephone Encounter (Signed)
Patient called to discuss post bariatric surgery follow up questions.  See below:   1.  Tell me about your pain and pain management?denies  2.  Let's talk about fluid intake.  How much total fluid are you taking in?54 ounces  3.  How much protein have you taken in the last 2 days?60 grams of protein  4.  Have you had nausea?  Tell me about when have experienced nausea and what you did to help?denies  5.  Has the frequency or color changed with your urine?no problems with urine  6.  Tell me what your incisions look like?incisions ok  7.  Have you been passing gas? BM?had bm since discharge  8.  If a problem or question were to arise who would you call?  Do you know contact numbers for [facility], [facility], and [facility]?aware of how to contact all services  9.  How has the walking going?walking around  10.  How are your vitamins and calcium going?  How are you taking them?started mvi and calcium

## 2019-12-31 ENCOUNTER — Other Ambulatory Visit: Payer: Self-pay

## 2019-12-31 ENCOUNTER — Encounter: Attending: General Surgery | Admitting: Skilled Nursing Facility1

## 2019-12-31 DIAGNOSIS — E669 Obesity, unspecified: Secondary | ICD-10-CM

## 2020-01-01 NOTE — Progress Notes (Signed)
2 Week Post-Operative Nutrition Class   Patient was seen on 03/20/18 for Post-Operative Nutrition education at the [facility].    Surgery date: 12/16/2019 Surgery type: VSG Start weight at [facility]: 351.7 lbs  Weight today: declined      The following the learning objectives were met by the patient during this course:  Identifies Phase 3 (Soft, High Proteins) Dietary Goals and will begin from 2 weeks post-operatively to 2 months post-operatively  Identifies appropriate sources of fluids and proteins   Identifies appropriate fat sources and healthy verses unhealthy fat types    States protein recommendations and appropriate sources post-operatively  Identifies the need for appropriate texture modifications, mastication, and bite sizes when consuming solids  Identifies appropriate multivitamin and calcium sources post-operatively  Describes the need for physical activity post-operatively and will follow MD recommendations  States when to call healthcare provider regarding medication questions or post-operative complications   Handouts given during class include:  Phase 3A: Soft, High Protein Diet Handout  Phase 3 High Protein Meals  Healthy Fats   Follow-Up Plan: Patient will follow-up at [facility] in 6 weeks for 2 month post-op nutrition visit for diet advancement per MD.

## 2020-01-06 ENCOUNTER — Telehealth: Payer: Self-pay | Admitting: Skilled Nursing Facility1

## 2020-01-06 NOTE — Telephone Encounter (Signed)
RD called pt to verify fluid intake once starting soft, solid proteins 2 week post-bariatric surgery.   Daily Fluid intake: 64 oz Daily Protein intake: 60 g   Concerns/issues:   No concerns.  

## 2020-01-07 ENCOUNTER — Ambulatory Visit: Payer: Federal, State, Local not specified - PPO

## 2020-02-13 ENCOUNTER — Other Ambulatory Visit: Payer: Self-pay

## 2020-02-13 ENCOUNTER — Encounter: Attending: General Surgery | Admitting: Skilled Nursing Facility1

## 2020-02-13 DIAGNOSIS — E669 Obesity, unspecified: Secondary | ICD-10-CM | POA: Diagnosis not present

## 2020-02-13 NOTE — Progress Notes (Signed)
Bariatric Nutrition Follow-Up Visit Medical Nutrition Therapy   2 Months Post-Operative Sleeve  NUTRITION ASSESSMENT  Anthropometrics  Start weight at [facility]: 351.7 lbs (date: 02/13/2020) Today's weight: 315.7 lbs  Body Composition Scale 02/13/2020  Weight  lbs 315.7  Total Body Fat  % 48.9     Visceral Fat 20  Fat-Free Mass  % 51     Total Body Water  % 40     Muscle-Mass  lbs 34.6  BMI 49.2  Body Fat Displacement ---        Torso  lbs 95.9        Left Leg  lbs 19.1        Right Leg  lbs 19.1        Left Arm  lbs 9.5        Right Arm  lbs 9.5   Clinical  Medical hx: prediabetes, migraines Medications:  Labs:    Lifestyle & Dietary Hx  Pt states she has not been sleeping well stating she has been seeing a counselor which has been helpful emotionally.   Estimated daily fluid intake: 60-100 oz Estimated daily protein intake: 70+ g Supplements: multi and calcium Current average weekly physical activity: walking at work  24-Hr Dietary Recall First Meal: protein shake or yogurt Snack: prechuto or cheese Second Meal: tuna + french onion dip or chicken or shrimp Snack: walnuts or lunch meat Third Meal:  tuna + french onion dip or chicken or shrimp Snack:  Beverages: water  Post-Op Goals/ Signs/ Symptoms Using straws: no Drinking while eating: no Chewing/swallowing difficulties: no Changes in vision: no Changes to mood/headaches: o Hair loss/changes to skin/nails: no Difficulty focusing/concentrating: no Sweating: no Dizziness/lightheadedness: no Palpitations: no  Carbonated/caffeinated beverages: no N/V/D/C/Gas: using supplement for bowel habits unsure what Abdominal pain: no Dumping syndrome: no    NUTRITION DIAGNOSIS  Overweight/obesity ([place]-3.3) related to past poor dietary habits and physical inactivity as evidenced by completed bariatric surgery and following dietary guidelines for continued weight loss and healthy nutrition status.     NUTRITION  INTERVENTION Nutrition counseling (C-1) and education (E-2) to facilitate bariatric surgery goals, including: . Diet advancement to the next phase (phase 4) now including non starchy vegetables . The importance of consuming adequate calories as well as certain nutrients daily due to the body's need for essential vitamins, minerals, and fats . The importance of daily physical activity and to reach a goal of at least 150 minutes of moderate to vigorous physical activity weekly (or as directed by their physician) due to benefits such as increased musculature and improved lab values . The importance of intuitive eating specifically learning hunger-satiety cues and understanding the importance of learning a new body: The importance of mindful eating to avoid grazing behaviors   Goals: -Continue to aim for a minimum of 64 fluid ounces 7 days a week with at least 30 ounces being plain water -Eat non-starchy vegetables 2 times a day 7 days a week -Start out with soft cooked vegetables today and tomorrow; if tolerated begin to eat raw vegetables or cooked including salads -Eat your 3 ounces of protein first then start in on your non-starchy vegetables; once you understand how much of your meal leads to satisfaction and not full while still eating 3 ounces of protein and non-starchy vegetables you can eat them in any order  -Continue to aim for 30 minutes of activity at least 5 times a week -Do NOT cook with/add to your food: alfredo sauce, cheese sauce,  barbeque sauce, ketchup, fat back, butter, bacon grease, grease, Crisco, OR SUGAR  Handouts Provided Include   Phase 4   Learning Style & Readiness for Change Teaching method utilized: Visual & Auditory  Demonstrated degree of understanding via: Teach Back  Readiness Level: Action Barriers to learning/adherence to lifestyle change: none identified   RD's Notes for Next Visit . Assess adherence to pt chosen goals   MONITORING & EVALUATION Dietary  intake, weekly physical activity, body weight  Next Steps Patient is to follow-up in 3 months

## 2020-05-18 ENCOUNTER — Ambulatory Visit: Admitting: Skilled Nursing Facility1

## 2020-06-01 ENCOUNTER — Encounter: Attending: General Surgery | Admitting: Skilled Nursing Facility1

## 2020-06-01 ENCOUNTER — Other Ambulatory Visit: Payer: Self-pay

## 2020-06-01 NOTE — Progress Notes (Signed)
Bariatric Nutrition Follow-Up Visit Medical Nutrition Therapy   Post-Operative Sleeve 12/16/2019  NUTRITION ASSESSMENT  Anthropometrics  Start weight at [facility]: 351.7 lbs (date: 02/13/2020) Today's weight: 273.1 lbs; body fat % 45.7; TBW% 41.6  Body Composition Scale 02/13/2020  Weight  lbs 315.7  Total Body Fat  % 48.9     Visceral Fat 20  Fat-Free Mass  % 51     Total Body Water  % 40     Muscle-Mass  lbs 34.6  BMI 49.2  Body Fat Displacement ---        Torso  lbs 95.9        Left Leg  lbs 19.1        Right Leg  lbs 19.1        Left Arm  lbs 9.5        Right Arm  lbs 9.5   Clinical  Medical hx: prediabetes, migraines Medications:  Labs:    Lifestyle & Dietary Hx  Continued: Pt states she has not been sleeping well stating she has been seeing a counselor which has been helpful emotionally.   Pt states she has been in the H with her sister who is having troubles and will help take care of her. Pt reports having non starchy vegetables 2 times a day 7 days a week: upon further diving fruit was consider vegetables: Dietitian educated pt on this topic.  Pt states she has been having some reflux realizing cashews giver her reflux and eating too fast stating she will drive and eat.    Estimated daily fluid intake: 60-100 oz Estimated daily protein intake: 70+ g Supplements: multi and calcium Current average weekly physical activity: walking at work; not sitting as long getting up more often; not wanting to increase movement due to pain in her back  24-Hr Dietary Recall First Meal: protein shake or yogurt Snack:  fruit or oatmeal Second Meal: tuna + french onion dip or chicken or shrimp or soup or chili Snack: walnuts or lunch meat or protein cookie (15g protein) or quest peanut butter cup Third Meal:  tuna + french onion dip or chicken or shrimp or meat + onion + pepper Snack: half protein shake and nuts Beverages: water  Post-Op Goals/ Signs/ Symptoms Using straws:  no Drinking while eating: no Chewing/swallowing difficulties: no Changes in vision: no Changes to mood/headaches: o Hair loss/changes to skin/nails: no Difficulty focusing/concentrating: no Sweating: no Dizziness/lightheadedness: no Palpitations: no  Carbonated/caffeinated beverages: no N/V/D/C/Gas: using supplement for bowel habits unsure what; some constipation  Abdominal pain: no Dumping syndrome: no    NUTRITION DIAGNOSIS  Overweight/obesity ([place]-3.3) related to past poor dietary habits and physical inactivity as evidenced by completed bariatric surgery and following dietary guidelines for continued weight loss and healthy nutrition status.     NUTRITION INTERVENTION Nutrition counseling (C-1) and education (E-2) to facilitate bariatric surgery goals, including: . Diet advancement to the next phase (phase 5) now including starchy vegetables . The importance of consuming adequate calories as well as certain nutrients daily due to the body's need for essential vitamins, minerals, and fats . The importance of daily physical activity and to reach a goal of at least 150 minutes of moderate to vigorous physical activity weekly (or as directed by their physician) due to benefits such as increased musculature and improved lab values . The importance of intuitive eating specifically learning hunger-satiety cues and understanding the importance of learning a new body: The importance of mindful eating to avoid grazing  behaviors  Importance of vegetables . To have an overall healthy diet, adult men and women are recommended to consume anywhere from 2-3 cups of vegetables daily. Vegetables provide a wide range of vitamins and minerals such as vitamin A, vitamin C, potassium, and folic acid. According to the [employer], including fruit and vegetables daily may reduce the risk of cardiovascular disease, certain cancers, and other non-communicable diseases  Goals: -Continue to aim for  a minimum of 64 fluid ounces 7 days a week with at least 30 ounces being plain water -Eat non-starchy vegetables 2 times a day 7 days a week  Handouts Provided Include   Phase 5  Learning Style & Readiness for Change Teaching method utilized: Visual & Auditory  Demonstrated degree of understanding via: Teach Back  Readiness Level: Action Barriers to learning/adherence to lifestyle change: none identified   RD's Notes for Next Visit . Assess adherence to pt chosen goals   MONITORING & EVALUATION Dietary intake, weekly physical activity, body weight  Next Steps Patient is to follow-up in 3 months

## 2020-07-13 ENCOUNTER — Encounter (HOSPITAL_COMMUNITY): Payer: Self-pay

## 2020-07-13 ENCOUNTER — Ambulatory Visit (HOSPITAL_COMMUNITY)
Admission: EM | Admit: 2020-07-13 | Discharge: 2020-07-13 | Disposition: A | Discharge status: Home / Self Care | Payer: Federal, State, Local not specified - PPO | Attending: Family Medicine | Admitting: Family Medicine

## 2020-07-13 ENCOUNTER — Other Ambulatory Visit: Payer: Self-pay

## 2020-07-13 DIAGNOSIS — T148XXA Other injury of unspecified body region, initial encounter: Secondary | ICD-10-CM | POA: Diagnosis not present

## 2020-07-13 DIAGNOSIS — M5412 Radiculopathy, cervical region: Secondary | ICD-10-CM

## 2020-07-13 DIAGNOSIS — M5431 Sciatica, right side: Secondary | ICD-10-CM

## 2020-07-13 DIAGNOSIS — S161XXA Strain of muscle, fascia and tendon at neck level, initial encounter: Secondary | ICD-10-CM

## 2020-07-13 MED ORDER — KETOROLAC TROMETHAMINE 30 MG/ML IJ SOLN
INTRAMUSCULAR | Status: AC
  Filled 2020-07-13: qty 1

## 2020-07-13 MED ORDER — KETOROLAC TROMETHAMINE 30 MG/ML IJ SOLN
30.0000 mg | Freq: Once | INTRAMUSCULAR | Status: AC
  Administered 2020-07-13: 30 mg via INTRAMUSCULAR

## 2020-07-13 MED ORDER — TRAMADOL HCL 50 MG PO TABS
50.0000 mg | ORAL_TABLET | Freq: Four times a day (QID) | ORAL | 0 refills | Status: DC | PRN
Start: 2020-07-13 — End: 2021-04-06

## 2020-07-13 NOTE — ED Triage Notes (Signed)
Pt presents with complaints of neck pain, back pain, right shoulder pain, and numbness in her right wrist. Patient states she has been having back pain and also started having neck pain x 2 weeks ago. Reports she has been using ice, heat, and otc medication with no relief. Pt also endorses headaches.

## 2020-07-13 NOTE — Discharge Instructions (Addendum)
I have sent in tramadol for you to take for breakthrough pain, 1 tablet every 6 hours as needed  You have received a toradol injection for pain in the office today  May continue tylenol, heat, muscle rubs, rest, muscle relaxers  Follow up with this office or with primary care if symptoms are persisting.  Follow up in the ER for high fever, trouble swallowing, trouble breathing, other concerning symptoms.

## 2020-07-13 NOTE — ED Provider Notes (Signed)
[facility]   [id number] 07/13/20 Arrival Time: 1649  JE:HUDJS PAIN  SUBJECTIVE: History from: patient. Meghan Orr is a 50 y.o. female complains of neck and pain that began about 2 weeks ago. Denies a precipitating event or specific injury.  Localizes the pain to the right neck, radiates down right arm, right low back and radiates down right leg.  Describes the pain as constant and achy in character with intermittent sharp pain with activity. Has tried OTC medications without relief. Symptoms are made worse with activity. Denies similar symptoms in the past.  Denies fever, chills, erythema, ecchymosis, effusion, weakness, numbness, saddle paresthesias, loss of bowel or bladder function.      ROS: As per HPI.  All other pertinent ROS negative.     Past Medical History:  Diagnosis Date   Anxiety    Arthritis    Depression    Headache    Pre-diabetes    Past Surgical History:  Procedure Laterality Date   HEMICOLECTOMY Right 2002   SHOULDER ARTHROSCOPY Right    UPPER GI ENDOSCOPY N/A 12/16/2019   Procedure: UPPER GI ENDOSCOPY;  Surgeon: Gaynelle Adu, MD;  Location: [facility];  Service: General;  Laterality: N/A;   Allergies  Allergen Reactions   Eggs Or Egg-Derived Products Hives and Swelling    Facial Swelling   Morphine Hives   Sulfa Antibiotics Nausea And Vomiting   No current facility-administered medications on file prior to encounter.   Current Outpatient Medications on File Prior to Encounter  Medication Sig Dispense Refill   APPLE CIDER VINEGAR PO Take 1 tablet by mouth daily.     ASHWAGANDHA PO Take 1 tablet by mouth daily.     cyclobenzaprine (FLEXERIL) 10 MG tablet Take 10 mg by mouth 3 (three) times daily as needed for muscle spasms.     diclofenac Sodium (VOLTAREN) 1 % GEL Apply 2 g topically 3 (three) times daily as needed (back pain).     hydrOXYzine (ATARAX/VISTARIL) 10 MG tablet Take 10 mg by mouth 3 (three) times daily as needed for anxiety.       lidocaine (LIDODERM) 5 % Place 1 patch onto the skin daily.     Multiple Vitamin (MULTIVITAMIN WITH MINERALS) TABS tablet Take 1 tablet by mouth daily.     ondansetron (ZOFRAN-ODT) 4 MG disintegrating tablet Take 1 tablet (4 mg total) by mouth every 6 (six) hours as needed for nausea or vomiting. 20 tablet 0   pantoprazole (PROTONIX) 40 MG tablet Take 1 tablet (40 mg total) by mouth daily. 90 tablet 0   prazosin (MINIPRESS) 2 MG capsule Take 2 mg by mouth at bedtime.     topiramate (TOPAMAX) 200 MG tablet Take 200 mg by mouth 2 (two) times daily.      traZODone (DESYREL) 100 MG tablet Take 100 mg by mouth at bedtime.      VITAMIN D, CHOLECALCIFEROL, PO Take by mouth. (Patient not taking: Reported on 12/05/2019)     [DISCONTINUED] SUMAtriptan (IMITREX) 25 MG tablet Please take 1 tablet at the onset of head.May repeat in 2 hours if headache persists or recurs. 10 tablet 0   Social History   Socioeconomic History   Marital status: Divorced    Spouse name: Not on file   Number of children: 2   Years of education: Not on file   Highest education level: Not on file  Occupational History   Occupation: optomitrist tech  Tobacco Use   Smoking status: Former  Pack years: 0.00    Types: Cigarettes    Quit date: 01/25/2015    Years since quitting: 5.4   Smokeless tobacco: Never  Vaping Use   Vaping Use: Never used  Substance and Sexual Activity   Alcohol use: Yes    Comment: 0-1 drink per week   Drug use: No   Sexual activity: Not on file  Other Topics Concern   Not on file  Social History Narrative   Patient is right-handed. She is divorced and lives in a 2 story house with her son. She drinks 2 cups of coffee a day and a soda QOD. She is active at work.   Social Determinants of Health   Financial Resource Strain: Not on file  Food Insecurity: Not on file  Transportation Needs: Not on file  Physical Activity: Not on file  Stress: Not on file  Social Connections: Not on file   Intimate Partner Violence: Not on file   Family History  Problem Relation Age of Onset   Diabetes Father    Heart attack Father    Meniere's disease Sister     OBJECTIVE:  Vitals:   07/13/20 1810  BP: 133/74  Pulse: 80  Resp: 19  Temp: 98 F (36.7 C)  SpO2: 100%    General appearance: ALERT; in no acute distress.  Head: NCAT Lungs: Normal respiratory effort CV: pulses 2+ bilaterally. Cap refill < 2 seconds Musculoskeletal:  Inspection: Skin warm, dry, clear and intact No erythema, effusion noted Palpation: Right neck, right low back tender to palpation ROM: Limited ROM active and passive to neck and back Skin: warm and dry Neurologic: Ambulates without difficulty; Sensation intact about the upper/ lower extremities Psychological: alert and cooperative; normal mood and affect  DIAGNOSTIC STUDIES:  No results found.   ASSESSMENT & PLAN:  1. Muscle strain   2. Strain of neck muscle, initial encounter   3. Back pain with right-sided sciatica   4. Cervical radiculopathy       Meds ordered this encounter  Medications   ketorolac (TORADOL) 30 MG/ML injection 30 mg   traMADol (ULTRAM) 50 MG tablet    Sig: Take 1 tablet (50 mg total) by mouth every 6 (six) hours as needed.    Dispense:  15 tablet    Refill:  0    Order Specific Question:   Supervising Provider    Answer:   Merrilee Jansky [[id number]]   Toradol 30mg  IM in office today Prescribed tramadol for breakthrough pain Continue tylenol Continue heat, muscle rubs, home muscle relaxers Continue conservative management of rest, ice, and gentle stretches No anti-inflammatories given hx bariatric surgery Follow up with PCP if symptoms persist Return or go to the ER if you have any new or worsening symptoms (fever, chills, chest pain, abdominal pain, changes in bowel or bladder habits, pain radiating into lower legs)   [place] Controlled Substances Registry consulted for this patient. I feel the risk/benefit  ratio today is favorable for proceeding with this prescription for a controlled substance. Medication sedation precautions given.  Reviewed expectations re: course of current medical issues. Questions answered. Outlined signs and symptoms indicating need for more acute intervention. Patient verbalized understanding. After Visit Summary given.        , NP 07/13/20 [phone]

## 2020-08-06 ENCOUNTER — Other Ambulatory Visit: Payer: Self-pay

## 2020-08-06 ENCOUNTER — Ambulatory Visit
Admission: EM | Admit: 2020-08-06 | Discharge: 2020-08-06 | Disposition: A | Discharge status: Home / Self Care | Payer: Federal, State, Local not specified - PPO | Attending: Emergency Medicine | Admitting: Emergency Medicine

## 2020-08-06 ENCOUNTER — Encounter: Payer: Self-pay | Admitting: Emergency Medicine

## 2020-08-06 DIAGNOSIS — J069 Acute upper respiratory infection, unspecified: Secondary | ICD-10-CM

## 2020-08-06 MED ORDER — AMOXICILLIN-POT CLAVULANATE 875-125 MG PO TABS: 1.0000 | ORAL_TABLET | Freq: Two times a day (BID) | ORAL | 0 refills | Status: DC

## 2020-08-06 MED ORDER — FLUTICASONE PROPIONATE 50 MCG/ACT NA SUSP
1.0000 | Freq: Every day | NASAL | 0 refills | Status: AC
Start: 2020-08-06 — End: ?

## 2020-08-06 MED ORDER — BENZONATATE 200 MG PO CAPS: 200.0000 mg | ORAL_CAPSULE | Freq: Three times a day (TID) | ORAL | 0 refills | Status: AC | PRN

## 2020-08-06 NOTE — ED Provider Notes (Signed)
[facility]    CSN: [id number] Arrival date & time: 08/06/20  1458      History   Chief Complaint Chief Complaint  Patient presents with   URI    HPI Meghan Orr is a 50 y.o. female presenting today for evaluation of URI symptoms.  Reports associated sore throat, headache, ear pain.  Prior COVID test negative.  Symptoms began 3 to 4 days ago.  Reports she has had a lot of right-sided ear pain and eye discomfort.  Denies change in vision, watering.  Has had some light sensitivity.  Denies contact use.  Using Tylenol without full relief of symptoms.  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      Home Medications    Prior to Admission medications   Medication Sig Start Date End Date Taking? Authorizing Provider  amoxicillin-clavulanate (AUGMENTIN) 875-125 MG tablet Take 1 tablet by mouth every 12 (twelve) hours. 08/10/20  Yes Westyn Keatley C, PA-C  benzonatate (TESSALON) 200 MG capsule Take 1 capsule (200 mg total) by mouth 3 (three) times daily as needed for up to 7 days for cough. 08/06/20 08/13/20 Yes Malek Skog C, PA-C  fluticasone (FLONASE) 50 MCG/ACT nasal spray Place 1-2 sprays into both nostrils daily. 08/06/20  Yes Felicitas Sine C, PA-C  APPLE CIDER VINEGAR PO Take 1 tablet by mouth daily.    [provider]   ASHWAGANDHA PO Take 1 tablet by mouth daily.    [provider]  cyclobenzaprine (FLEXERIL) 10 MG tablet Take 10 mg by mouth 3 (three) times daily as needed for muscle spasms.    [provider]  diclofenac Sodium (VOLTAREN) 1 % GEL Apply 2 g topically 3 (three) times daily as needed (back pain).    [provider]  hydrOXYzine (ATARAX/VISTARIL) 10 MG tablet Take 10 mg by mouth 3 (three) times daily as needed for anxiety.     [provider]  lidocaine (LIDODERM) 5 % Place 1 patch onto the skin daily. 12/12/19   [provider]  Multiple Vitamin (MULTIVITAMIN WITH MINERALS) TABS tablet Take 1 tablet by mouth daily.    [provider]  ondansetron (ZOFRAN-ODT) 4 MG disintegrating tablet Take 1 tablet (4 mg total) by mouth every 6 (six) hours as needed for nausea or vomiting. 12/17/19   Gaynelle Adu, MD  pantoprazole (PROTONIX) 40 MG tablet Take 1 tablet (40 mg total) by mouth daily. 12/17/19   Gaynelle Adu, MD  prazosin (MINIPRESS) 2 MG capsule Take 2 mg by mouth at bedtime.    [provider]  topiramate (TOPAMAX) 200 MG tablet Take 200 mg by mouth 2 (two) times daily.     [provider]  traMADol (ULTRAM) 50 MG tablet Take 1 tablet (50 mg total)  by mouth every 6 (six) hours as needed. 07/13/20   Moshe Cipro, NP  traZODone (DESYREL) 100 MG tablet Take 100 mg by mouth at bedtime.     [provider]  VITAMIN D, CHOLECALCIFEROL, PO Take by mouth. Patient not taking: Reported on 12/05/2019    [provider]  SUMAtriptan (IMITREX) 25 MG tablet Please take 1 tablet at the onset of head.May repeat in 2 hours if headache persists or recurs. 12/10/18 12/05/19  LampteyBritta Mccreedy, MD    Family History Family History  Problem Relation Age of Onset   Diabetes Father    Heart attack Father    Meniere's disease Sister     Social History Social History   Tobacco Use   Smoking status: Former    Types:  Cigarettes    Quit date: 01/25/2015    Years since quitting: 5.5   Smokeless tobacco: Never  Vaping Use   Vaping Use: Never used  Substance Use Topics   Alcohol use: Yes    Comment: 0-1 drink per week   Drug use: No     Allergies   Eggs or egg-derived products, Morphine, and Sulfa antibiotics   Review of Systems Review of Systems  Constitutional:  Negative for activity change, appetite change, chills, fatigue and fever.  HENT:  Positive for ear pain, rhinorrhea and sore throat. Negative for congestion, sinus pressure and trouble swallowing.   Eyes:  Negative for discharge and redness.  Respiratory:  Positive for cough. Negative for chest tightness and shortness of breath.   Cardiovascular:  Negative for chest pain.  Gastrointestinal:  Negative for abdominal pain, diarrhea, nausea and vomiting.  Musculoskeletal:  Negative for myalgias.  Skin:  Negative for rash.  Neurological:  Positive for headaches. Negative for dizziness and light-headedness.    Physical Exam Triage Vital Signs ED Triage Vitals  Enc Vitals Group     BP      Pulse      Resp      Temp      Temp src      SpO2      Weight      Height      Head Circumference      Peak Flow      Pain Score      Pain Loc      Pain Edu?      Excl. in GC?    No data found.  Updated Vital Signs BP 116/81 (BP Location: Left Wrist)   Pulse 89   Temp 98.5 F (36.9 C) (Oral)   Resp 18   LMP 06/03/2020 Comment: does not bleed regularly with IUD  SpO2 97%   Visual Acuity Right Eye Distance:   Left Eye Distance:   Bilateral Distance:    Right Eye Near:   Left Eye Near:    Bilateral Near:     Physical Exam Vitals and nursing note reviewed.  Constitutional:      Appearance: She is well-developed.     Comments: No acute distress  HENT:     Head: Normocephalic and atraumatic.     Ears:     Comments: Bilateral ears without tenderness to palpation of external auricle, tragus and mastoid, EAC's without erythema or  swelling, TM's with good bony landmarks and cone of light. Non erythematous.      Nose: Nose normal.     Mouth/Throat:     Comments: Oral mucosa pink and moist, no tonsillar enlargement or exudate. Posterior pharynx patent  and nonerythematous, no uvula deviation or swelling. Normal phonation.  Eyes:     Conjunctiva/sclera: Conjunctivae normal.  Cardiovascular:     Rate and Rhythm: Normal rate.  Pulmonary:     Effort: Pulmonary effort is normal. No respiratory distress.     Comments: Breathing comfortably at rest, CTABL, no wheezing, rales or other adventitious sounds auscultated  Abdominal:     General: There is no distension.  Musculoskeletal:        General: Normal range of motion.     Cervical back: Neck supple.  Skin:    General: Skin is warm and dry.  Neurological:     Mental Status: She is alert and oriented to person, place, and time.     UC Treatments / Results  Labs (all labs ordered are listed, but only abnormal results are displayed) Labs Reviewed - No data to display  EKG   Radiology No results found.  Procedures Procedures (including critical care time)  Medications Ordered in UC Medications - No data to display  Initial Impression / Assessment and Plan / UC Course  I have reviewed the triage vital signs and the nursing notes.  Pertinent labs & imaging results that were available during my care of the patient were reviewed by me and considered in my medical decision making (see chart for details).     Viral URI with cough-exam reassuring, no sign of otitis media, no sign of tonsillitis, recommending continued symptomatic and supportive care as symptoms have been only going on for 3 to 4 days.  Recommendations provided.  Did provide prescription for Augmentin to pick up on Monday if not seeing any improvement within an additional 3 to 4 days. Final Clinical Impressions(s) / UC Diagnoses   Final diagnoses:  Viral URI with cough     Discharge  Instructions      Continue Claritin, add in Flonase nasal spray Tylenol as needed Tessalon/benzonatate every 8 hours for cough Rest and fluids May fill prescription for Augmentin to treat sinus infection if not seeing any improvement over the next 3 to 4 days, may pick up on Monday from pharmacy Please return if not improving or worsening, any symptoms changing     ED Prescriptions     Medication Sig Dispense Auth. Provider   fluticasone (FLONASE) 50 MCG/ACT nasal spray Place 1-2 sprays into both nostrils daily. 16 g Fred Franzen C, PA-C   benzonatate (TESSALON) 200 MG capsule Take 1 capsule (200 mg total) by mouth 3 (three) times daily as needed for up to 7 days for cough. 28 capsule Tremayne Sheldon C, PA-C   amoxicillin-clavulanate (AUGMENTIN) 875-125 MG tablet Take 1 tablet by mouth every 12 (twelve) hours. 14 tablet Jearline Hirschhorn, [place] C, PA-C      PDMP not reviewed this encounter.   Lew Dawes, [place] 08/06/20 1531

## 2020-08-06 NOTE — ED Triage Notes (Addendum)
Patient presents to [facility] for evaluation of right ear pain and neck pain with nasal congestion x 4 days, sore throat.

## 2020-08-06 NOTE — Discharge Instructions (Addendum)
Continue Claritin, add in Flonase nasal spray Tylenol as needed Tessalon/benzonatate every 8 hours for cough Rest and fluids May fill prescription for Augmentin to treat sinus infection if not seeing any improvement over the next 3 to 4 days, may pick up on Monday from pharmacy Please return if not improving or worsening, any symptoms changing

## 2020-09-07 ENCOUNTER — Other Ambulatory Visit: Payer: Self-pay

## 2020-09-07 ENCOUNTER — Encounter: Payer: Federal, State, Local not specified - PPO | Attending: General Surgery | Admitting: Skilled Nursing Facility1

## 2020-09-07 NOTE — Progress Notes (Signed)
Bariatric Nutrition Follow-Up Visit Medical Nutrition Therapy   Post-Operative Sleeve 12/16/2019  NUTRITION ASSESSMENT  Anthropometrics  Start weight at [facility]: 351.7 lbs (date: 02/13/2020) Today's weight: 249.4 pounds  Body Composition Scale 02/13/2020  Weight  lbs 315.7  Total Body Fat  % 48.9     Visceral Fat 20  Fat-Free Mass  % 51     Total Body Water  % 40     Muscle-Mass  lbs 34.6  BMI 49.2  Body Fat Displacement ---        Torso  lbs 95.9        Left Leg  lbs 19.1        Right Leg  lbs 19.1        Left Arm  lbs 9.5        Right Arm  lbs 9.5   Clinical  Medical hx: prediabetes, migraines Medications:  Labs:    Lifestyle & Dietary Hx  Continued: Pt states she has not been sleeping well stating she has been seeing a counselor which has been helpful emotionally.   Pt states she has had a migraine for the last 2 days and has had 4 in the last month.  Pt states she sometimes gets bad reflux, sometimes in the middle of the night.  Pt states drinking plain water is pretty tough for her due to lack of flavor.    Estimated daily fluid intake: 60-100 oz Estimated daily protein intake: 70+ g Supplements: inappropriate multi and calcium Current average weekly physical activity: not wanting to increase movement due to pain in her back and numbness in her lower leg/foot (going to the doctor for this)  24-Hr Dietary Recall First Meal: half protein shake + coffee or yogurt Snack:  fruit + cheese Second Meal:soup + protein or salad + protein Snack: walnuts or lunch meat or protein cookie (15g protein) or quest peanut butter cup Snack: fruit or veggie Third Meal: salad + protein shake Snack: chicken + white rice + cabbage or cole slaw  Snack: nuts Beverages: water + flavorings  Post-Op Goals/ Signs/ Symptoms Using straws: no Drinking while eating: no Chewing/swallowing difficulties: no Changes in vision: no Changes to mood/headaches: no Hair loss/changes to skin/nails:  no Difficulty focusing/concentrating: no Sweating: no Dizziness/lightheadedness: no Palpitations: no  Carbonated/caffeinated beverages: no N/V/D/C/Gas: no Abdominal pain: no Dumping syndrome: no    NUTRITION DIAGNOSIS  Overweight/obesity ([place]-3.3) related to past poor dietary habits and physical inactivity as evidenced by completed bariatric surgery and following dietary guidelines for continued weight loss and healthy nutrition status.     NUTRITION INTERVENTION Nutrition counseling (C-1) and education (E-2) to facilitate bariatric surgery goals, including: The importance of consuming adequate calories as well as certain nutrients daily due to the body's need for essential vitamins, minerals, and fats The importance of daily physical activity and to reach a goal of at least 150 minutes of moderate to vigorous physical activity weekly (or as directed by their physician) due to benefits such as increased musculature and improved lab values The importance of intuitive eating specifically learning hunger-satiety cues and understanding the importance of learning a new body: The importance of mindful eating to avoid grazing behaviors  Importance of non starchy vegetables To have an overall healthy diet, adult men and women are recommended to consume anywhere from 2-3 cups of vegetables daily. Vegetables provide a wide range of vitamins and minerals such as vitamin A, vitamin C, potassium, and folic acid. According to the [employer], including  fruit and vegetables daily may reduce the risk of cardiovascular disease, certain cancers, and other non-communicable diseases Avoidance of grazing behaviors   Goals: -Continue to aim for a minimum of 64 fluid ounces 7 days a week with at least 30 ounces being plain water -Eat non-starchy vegetables 2 times a day 7 days a week -get the appropriate multivitamin capsule one a day -avoid eating more than one snack in between meals; measure out  your meals to ensure you get enough at meals to have confidence in reducing snacks -make half your fluids plain water  Handouts Previously Provided Include  Phase 5  Learning Style & Readiness for Change Teaching method utilized: Visual & Auditory  Demonstrated degree of understanding via: Teach Back  Readiness Level: Action Barriers to learning/adherence to lifestyle change: none identified   RD's Notes for Next Visit Assess adherence to pt chosen goals   MONITORING & EVALUATION Dietary intake, weekly physical activity, body weight  Next Steps Patient is to follow-up in 3 months

## 2021-02-20 IMAGING — RF DG UGI W SINGLE CM
8 of 12 series · 12 of 24 positions shown · non-contrast
Comparison: None.

CLINICAL DATA: Preop for bariatric surgery.

EXAM:
UPPER GI SERIES WITH KUB
TECHNIQUE: After obtaining a scout radiograph a routine upper GI series was
performed using thin density barium.
FLUOROSCOPY TIME:  Fluoroscopy Time:  1 minutes and 42 seconds
Radiation Exposure Index (if provided by the fluoroscopic device):
63 mGy
Number of Acquired Spot Images: 0

[Series 2: cp_standard · 0.52mm/px · 2 of 80 frames shown (1 of 8)]
[frame 13/80]
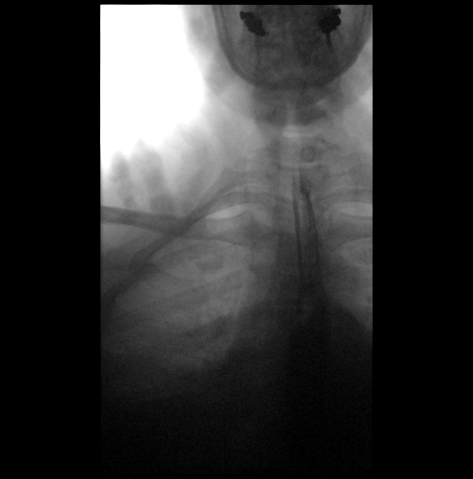
[frame 41/80]
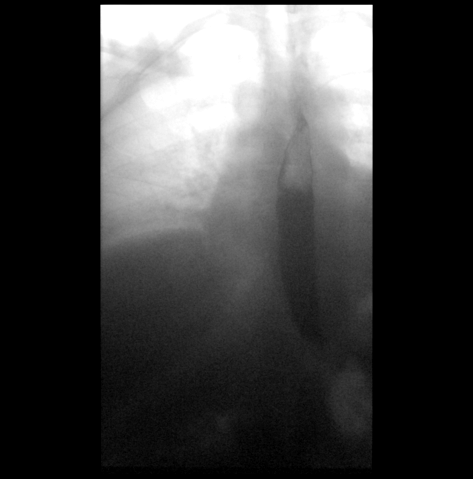

[Series 3: cp_standard · 0.52mm/px · 2 of 73 frames shown (2 of 8)]
[frame 11/73]
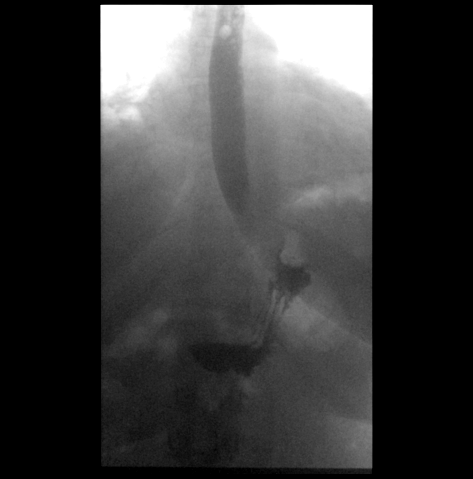
[frame 37/73]
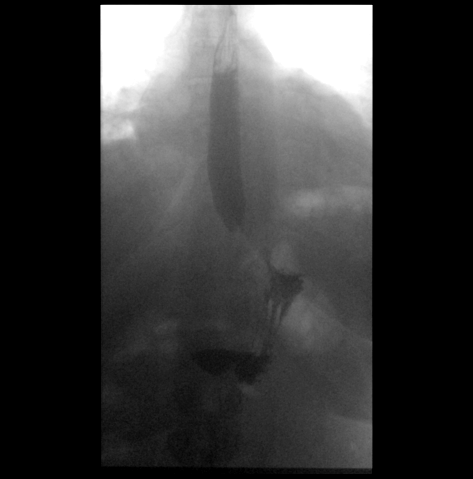

[Series 4: cp_standard · 0.52mm/px · 2 of 74 frames shown (3 of 8)]
[frame 12/74]
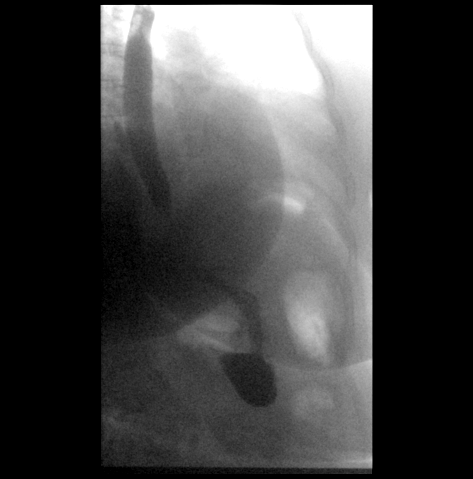
[frame 38/74]
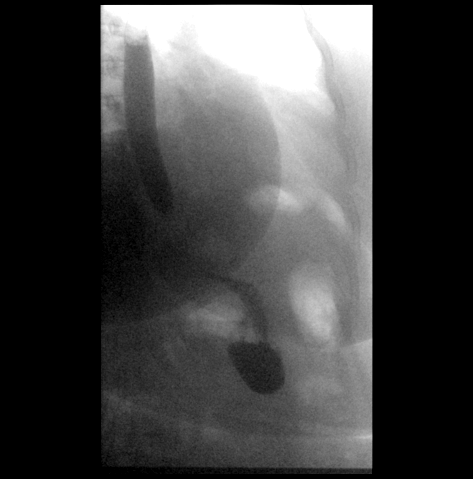

[Series 5: cp_standard · 0.26mm/px · 1 of 1 slices shown (4 of 8)]
[im 1/1]
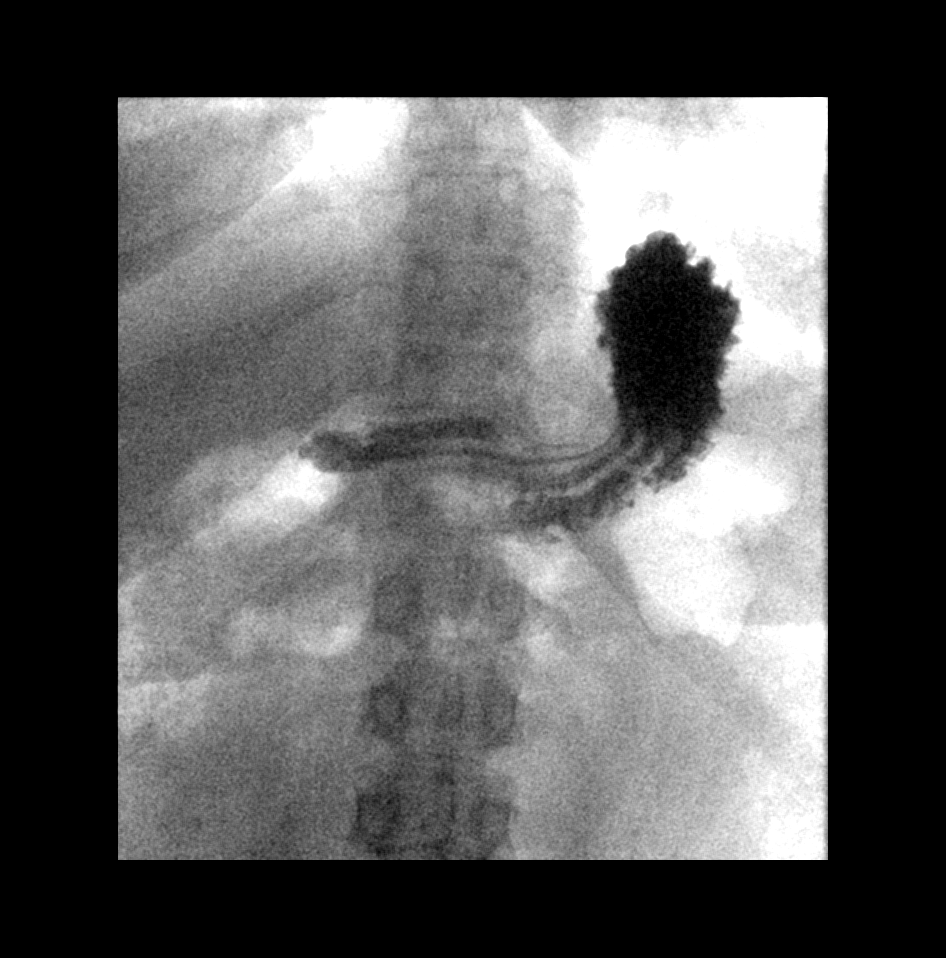

[Series 6: cp_standard · 0.51mm/px · 2 of 43 frames shown (5 of 8)]
[frame 7/43]
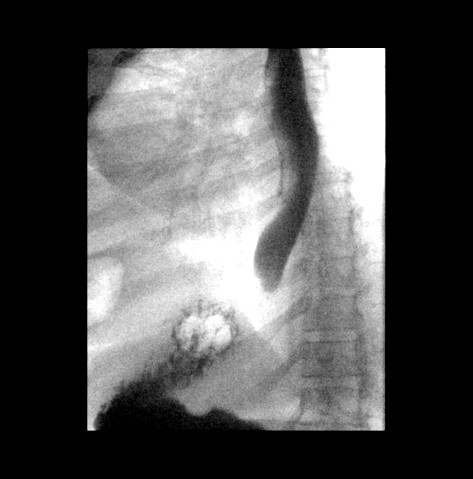
[frame 37/43]
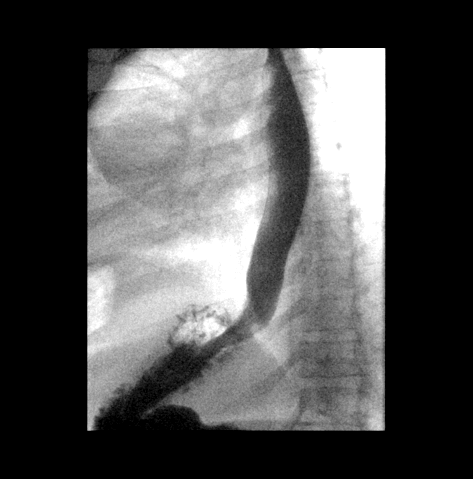

[Series 8: cp_standard · 0.26mm/px · 1 of 1 slices shown (6 of 8)]
[im 1/1]
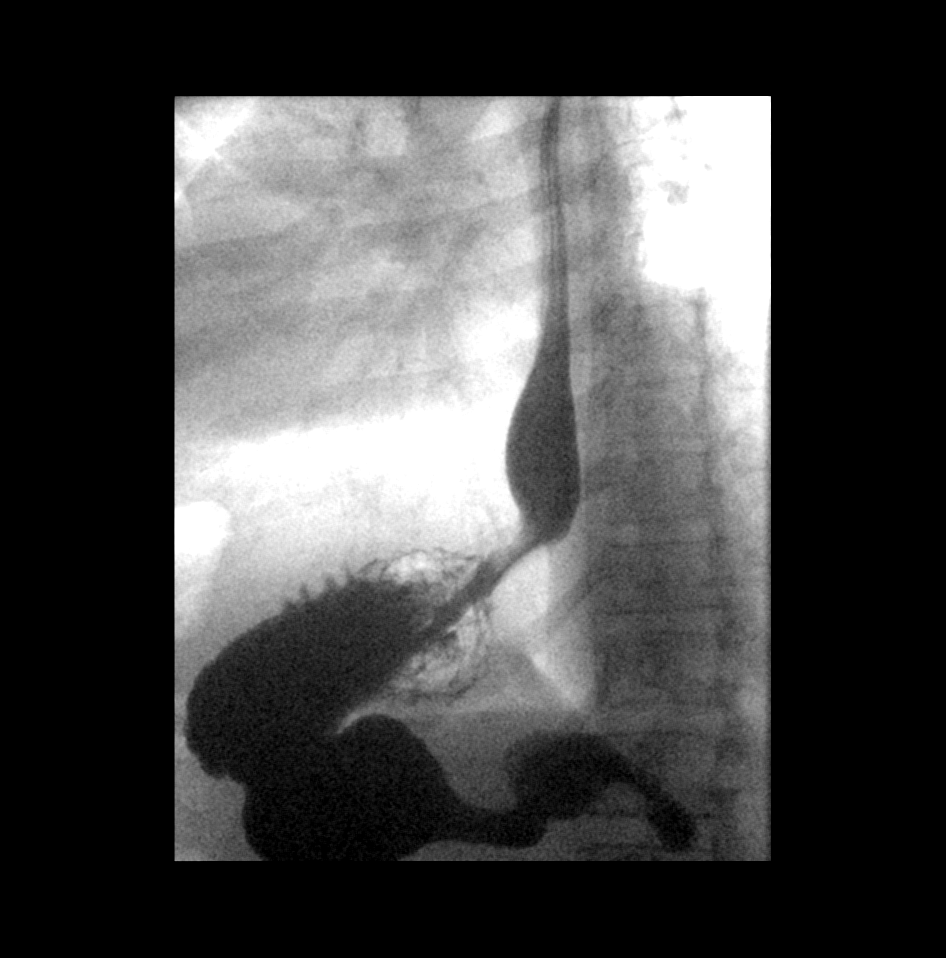

[Series 10: cp_standard · 0.26mm/px · 1 of 1 slices shown (7 of 8)]
[im 1/1]
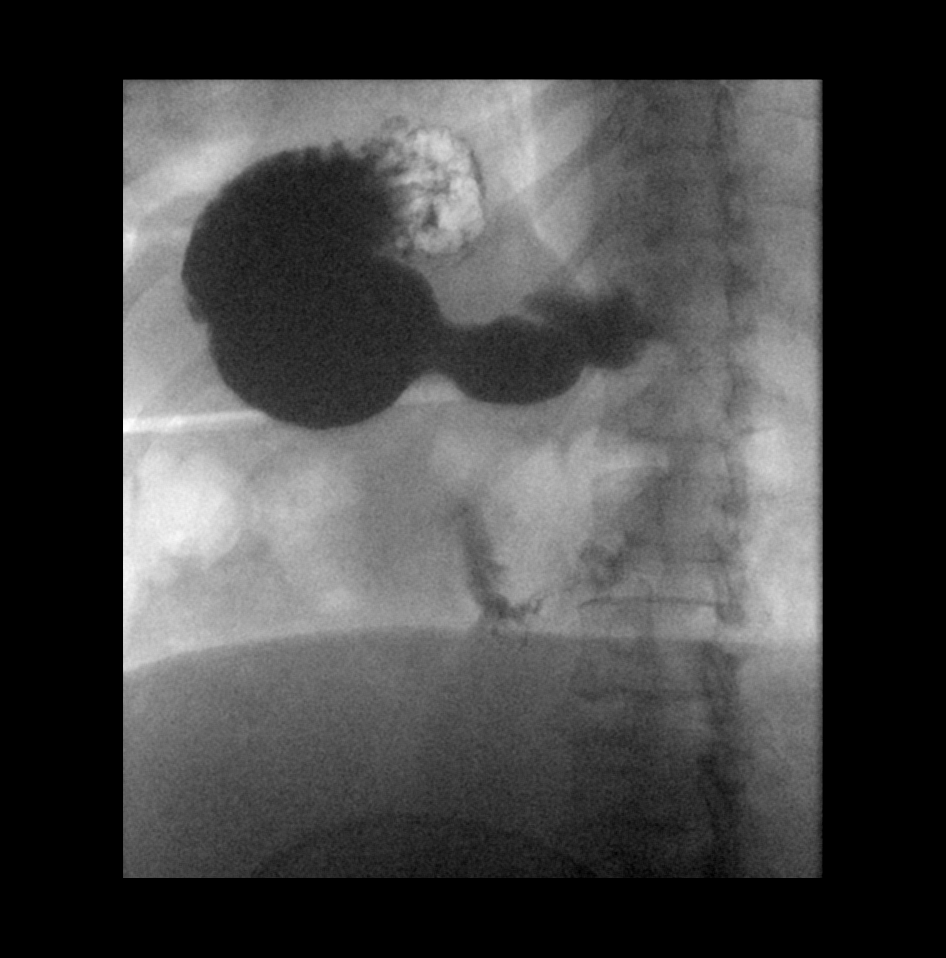

[Series 12: cp_standard · 0.26mm/px · 1 of 1 slices shown (8 of 8)]
[im 1/1]
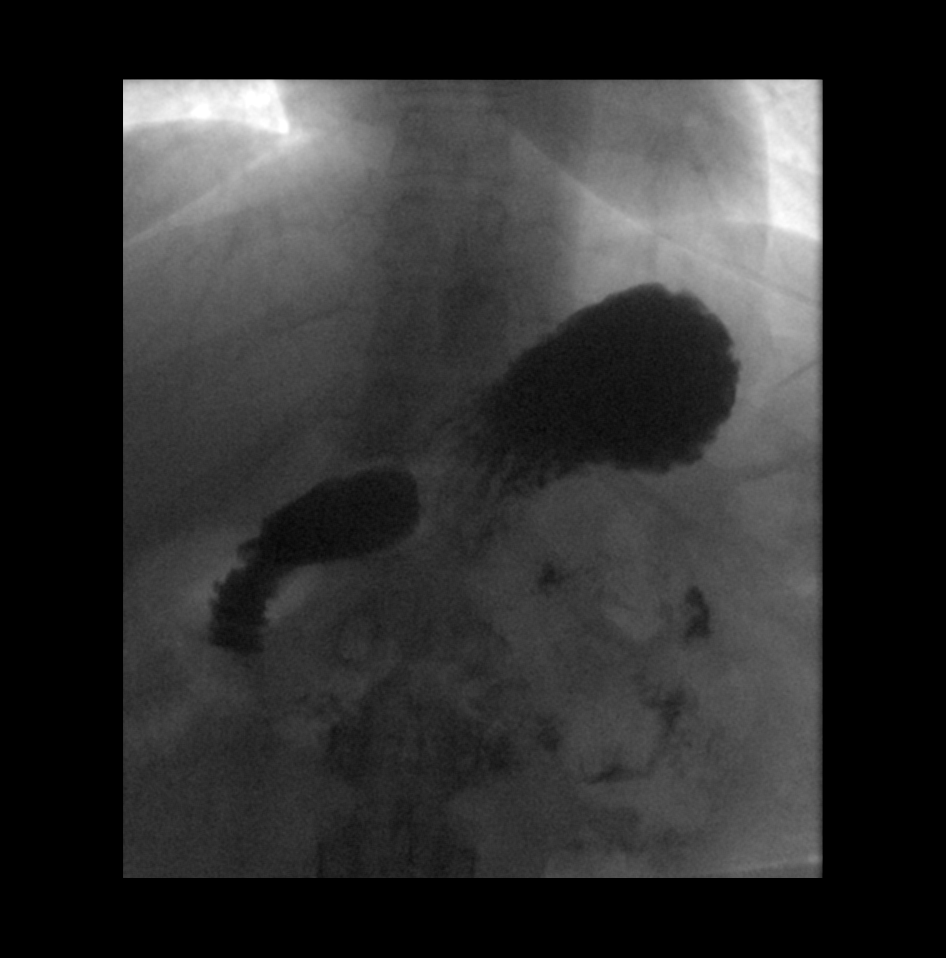

[12 of 24 positions shown; findings below may reference images not displayed]

FINDINGS: Normal esophageal motility. No intrinsic or extrinsic lesions of the
esophagus or identified. Small sliding-type hiatal hernia is noted.
No GE reflux was demonstrated.

The stomach demonstrates normal mucosal fold pattern. No lesions or
ulcers. The duodenal bulb and C-loop appear normal. The duodenal
jejunal junction is in its normal anatomic location.
IMPRESSION: 1. Small sliding-type hiatal hernia without demonstrable GE reflux.
2. Normal esophageal motility.
3. Normal appearance of the stomach and duodenum.

## 2021-04-06 ENCOUNTER — Encounter (HOSPITAL_COMMUNITY): Payer: Self-pay

## 2021-04-06 ENCOUNTER — Ambulatory Visit (HOSPITAL_COMMUNITY)
Admission: EM | Admit: 2021-04-06 | Discharge: 2021-04-06 | Disposition: A | Discharge status: Home / Self Care | Payer: Federal, State, Local not specified - PPO | Attending: Family Medicine | Admitting: Family Medicine

## 2021-04-06 ENCOUNTER — Other Ambulatory Visit: Payer: Self-pay

## 2021-04-06 DIAGNOSIS — M545 Low back pain, unspecified: Secondary | ICD-10-CM

## 2021-04-06 MED ORDER — KETOROLAC TROMETHAMINE 30 MG/ML IJ SOLN
30.0000 mg | Freq: Once | INTRAMUSCULAR | Status: AC
  Administered 2021-04-06: 30 mg via INTRAMUSCULAR

## 2021-04-06 MED ORDER — KETOROLAC TROMETHAMINE 30 MG/ML IJ SOLN
INTRAMUSCULAR | Status: AC
  Filled 2021-04-06: qty 1

## 2021-04-06 NOTE — ED Provider Notes (Addendum)
?[place] ? ? ? ?CSN: [id number] ?Arrival date & time: 04/06/21  1402 ? ? ?  ? ?History   ?Chief Complaint ?Chief Complaint  ?Patient presents with  ? Back Pain  ? ? ?HPI ?Meghan Orr is a 51 y.o. female.  ? ? ?Back Pain ?Here for worsening back pain in the last 3 days.  It began suddenly while she was standing for a little longer than usual in a line at the store.  No trauma or fall.  It is mainly bothering her in the right side of her lumbar region it feels tight.  No dysuria or hematuria or fever.  She has so far taken Tylenol and used heat on it ? ?She states she is not supposed to take ibuprofen due to her history of sleeve gastrectomy.  She is also allergic to eggs, morphine, and sulfa. ? ?No fills of narcotics since September 2022 ? ?Past Medical History:  ?Diagnosis Date  ? Anxiety   ? Arthritis   ? Depression   ? Headache   ? Pre-diabetes   ? ? ?Patient Active Problem List  ? Diagnosis Date Noted  ? Severe obesity ([place]) 12/16/2019  ? Prediabetes 12/16/2019  ? Osteoarthritis of right hip 12/16/2019  ? History of migraine headaches 12/16/2019  ? 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 ? ?Past Surgical History:  ?Procedure Laterality Date  ? HEMICOLECTOMY Right 2002  ? SHOULDER ARTHROSCOPY Right   ? UPPER GI ENDOSCOPY N/A 12/16/2019  ? Procedure: UPPER GI ENDOSCOPY;  Surgeon: Greer Pickerel, MD;  Location: [facility];  Service: General;  Laterality: N/A;  ? ? ?OB History   ?No obstetric history on file. ?  ? ? ? ?Home Medications   ? ?Prior to Admission medications   ?Medication Sig Start Date End Date Taking? Authorizing Provider  ?APPLE CIDER VINEGAR PO Take 1 tablet by mouth daily.    [provider]  ?ASHWAGANDHA PO Take 1 tablet by mouth daily.    [provider]  ?diclofenac Sodium (VOLTAREN) 1 % GEL Apply 2 g topically 3 (three) times daily as needed (back pain).    [provider]  ?fluticasone (FLONASE) 50 MCG/ACT nasal spray Place 1-2 sprays into both  nostrils daily. 08/06/20   Wieters, Hallie C, PA-C  ?hydrOXYzine (ATARAX/VISTARIL) 10 MG tablet Take 10 mg by mouth 3 (three) times daily as needed for anxiety.     [provider]  ?Multiple Vitamin (MULTIVITAMIN WITH MINERALS) TABS tablet Take 1 tablet by mouth daily.    [provider]  ?pantoprazole (PROTONIX) 40 MG tablet Take 1 tablet (40 mg total) by mouth daily. 12/17/19   Greer Pickerel, MD  ?prazosin (MINIPRESS) 2 MG capsule Take 2 mg by mouth at bedtime.    [provider]  ?topiramate (TOPAMAX) 200 MG tablet Take 200 mg by mouth 2 (two) times daily.     [provider]  ?traZODone (DESYREL) 100 MG tablet Take 100 mg by mouth at bedtime.     [provider]  ?SUMAtriptan (IMITREX) 25 MG tablet Please take 1 tablet at the onset of head.May repeat in 2 hours if headache persists or recurs. 12/10/18 12/05/19  Chase Picket, MD  ? ? ?Family History ?Family History  ?Problem Relation Age of Onset  ? Diabetes Father   ? Heart attack Father   ? Meniere's disease Sister   ? ? ?Social History ?Social History  ? ?Tobacco Use  ? Smoking status: Former  ?  Types: Cigarettes  ?  Quit date: 01/25/2015  ?  Years since quitting: 6.2  ? Smokeless tobacco: Never  ?Vaping Use  ? Vaping Use: Never used  ?Substance Use Topics  ? Alcohol use: Yes  ?  Comment: 0-1 drink per week  ? Drug use: No  ? ? ? ?Allergies   ?Eggs or egg-derived products, Morphine, and Sulfa antibiotics ? ? ?Review of Systems ?Review of Systems  ?Musculoskeletal:  Positive for back pain.  ? ? ?Physical Exam ?Triage Vital Signs ?ED Triage Vitals [04/06/21 1424]  ?Enc Vitals Group  ?   BP (!) 123/108  ?   Pulse Rate 75  ?   Resp 18  ?   Temp 98.3 ?F (36.8 ?C)  ?   Temp Source Oral  ?   SpO2 100 %  ?   Weight   ?   Height   ?   Head Circumference   ?   Peak Flow   ?   Pain Score   ?   Pain Loc   ?   Pain Edu?   ?   Excl. in [place]?   ? ?No data found. ? ?Updated Vital Signs ?BP (!) 123/108 (BP Location: Left Arm)    Pulse 75   Temp 98.3 ?F (36.8 ?C) (Oral)   Resp 18   SpO2 100%  ? ?Visual Acuity ?Right Eye Distance:   ?Left Eye Distance:   ?Bilateral Distance:   ? ?Right Eye Near:   ?Left Eye Near:    ?Bilateral Near:    ? ?Physical Exam ?Vitals reviewed.  ?Constitutional:   ?   General: She is not in acute distress. ?   Appearance: She is not toxic-appearing.  ?HENT:  ?   Mouth/Throat:  ?   Mouth: Mucous membranes are moist.  ?Eyes:  ?   Extraocular Movements: Extraocular movements intact.  ?   Pupils: Pupils are equal, round, and reactive to light.  ?Cardiovascular:  ?   Rate and Rhythm: Normal rate and regular rhythm.  ?   Heart sounds: No murmur heard. ?Pulmonary:  ?   Effort: Pulmonary effort is normal.  ?   Breath sounds: No wheezing, rhonchi or rales.  ?Musculoskeletal:     ?   General: Tenderness (And spasm of the right lumbar musculature) present.  ?   Cervical back: Neck supple.  ?Lymphadenopathy:  ?   Cervical: No cervical adenopathy.  ?Skin: ?   Capillary Refill: Capillary refill takes less than 2 seconds.  ?   Coloration: Skin is not jaundiced or pale.  ?Neurological:  ?   General: No focal deficit present.  ?   Mental Status: She is alert and oriented to person, place, and time.  ?Psychiatric:     ?   Behavior: Behavior normal.  ? ? ? ?UC Treatments / Results  ?Labs ?(all labs ordered are listed, but only abnormal results are displayed) ?Labs Reviewed - No data to display ? ?EKG ? ? ?Radiology ?No results found. ? ?Procedures ?Procedures (including critical care time) ? ?Medications Ordered in UC ?Medications - No data to display ? ?Initial Impression / Assessment and Plan / UC Course  ?I have reviewed the triage vital signs and the nursing notes. ? ?Pertinent labs & imaging results that were available during my care of the patient were reviewed by me and considered in my medical decision making (see chart for details). ? ?  ? ?We will treat with Toradol injection here.  She cannot  take Motrin so I will do a  small prescription of tramadol and some tizanidine as needed.  She will see spine doctor tomorrow. ?Final Clinical Impressions(s) / UC Diagnoses  ? ?Final diagnoses:  ?Acute right-sided low back pain without sciatica  ? ? ? ?Discharge Instructions   ? ?  ?You are given a shot of Toradol 30 mg here in the office ? ?Take tramadol 50 mg--1 every 6 hours as needed for pain.  This can make you sleepy or dizzy ? ?Take tizanidine 4 mg--1 every 8 hours as needed for muscle spasm or muscle pain.  This medicine can also make you sleepy or dizzy ? ?Continue using the heat on your sore muscle ? ? ? ? ?ED Prescriptions   ?None ?  ? ?I have reviewed the PDMP during this encounter. ?  ?Barrett Henle, MD ?04/06/21 1501 ? ?  ?Barrett Henle, MD ?04/06/21 1503 ? ?

## 2021-04-06 NOTE — ED Triage Notes (Signed)
Pt reports lower back pain x 2-4 days. No recent falls or known injury. This is a recurring issue for patient.  ?

## 2021-04-06 NOTE — Discharge Instructions (Addendum)
You are given a shot of Toradol 30 mg here in the office ? ?Take tramadol 50 mg--1 every 6 hours as needed for pain.  This can make you sleepy or dizzy ? ?Take tizanidine 4 mg--1 every 8 hours as needed for muscle spasm or muscle pain.  This medicine can also make you sleepy or dizzy ? ?Continue using the heat on your sore muscle ?

## 2021-06-25 ENCOUNTER — Other Ambulatory Visit: Payer: Self-pay | Admitting: Nurse Practitioner

## 2021-06-25 ENCOUNTER — Ambulatory Visit
Admission: RE | Admit: 2021-06-25 | Discharge: 2021-06-25 | Disposition: A | Discharge status: Home / Self Care | Payer: No Typology Code available for payment source | Source: Ambulatory Visit | Attending: Nurse Practitioner | Admitting: Nurse Practitioner

## 2021-06-25 DIAGNOSIS — M546 Pain in thoracic spine: Secondary | ICD-10-CM

## 2021-07-08 ENCOUNTER — Encounter (HOSPITAL_COMMUNITY): Payer: Self-pay | Admitting: *Deleted

## 2021-09-20 ENCOUNTER — Other Ambulatory Visit: Payer: Self-pay | Admitting: Nurse Practitioner

## 2021-09-20 DIAGNOSIS — M5412 Radiculopathy, cervical region: Secondary | ICD-10-CM

## 2021-09-20 DIAGNOSIS — M5416 Radiculopathy, lumbar region: Secondary | ICD-10-CM

## 2021-10-03 ENCOUNTER — Ambulatory Visit
Admission: RE | Admit: 2021-10-03 | Discharge: 2021-10-03 | Disposition: A | Discharge status: Home / Self Care | Payer: Federal, State, Local not specified - PPO | Source: Ambulatory Visit | Attending: Nurse Practitioner | Admitting: Nurse Practitioner

## 2021-10-03 DIAGNOSIS — M5412 Radiculopathy, cervical region: Secondary | ICD-10-CM

## 2021-10-03 DIAGNOSIS — M5416 Radiculopathy, lumbar region: Secondary | ICD-10-CM

## 2021-11-12 NOTE — Therapy (Deleted)
OUTPATIENT PHYSICAL THERAPY THORACOLUMBAR EVALUATION   Patient Name: Meghan Orr MRN: [id number] DOB:[birth year], 51 y.o., female Today's Date: 11/12/2021    Past Medical History:  Diagnosis Date   Anxiety    Arthritis    Depression    Headache    Pre-diabetes    Past Surgical History:  Procedure Laterality Date   HEMICOLECTOMY Right 2002   SHOULDER ARTHROSCOPY Right    UPPER GI ENDOSCOPY N/A 12/16/2019   Procedure: UPPER GI ENDOSCOPY;  Surgeon: Greer Pickerel, MD;  Location: [facility];  Service: General;  Laterality: N/A;   Patient Active Problem List   Diagnosis Date Noted   Severe obesity ([place]) 12/16/2019   Prediabetes 12/16/2019   Osteoarthritis of right hip 12/16/2019   History of migraine headaches 12/16/2019   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 {OPRCPAIN:27236}  PRECAUTIONS: None  WEIGHT BEARING RESTRICTIONS: No  FALLS:  Has patient fallen in last 6 months? No  LIVING ENVIRONMENT: Lives with: lives with their family  OCCUPATION: [employer]  PLOF: Independent  PATIENT GOALS: ***   OBJECTIVE:   DIAGNOSTIC FINDINGS:  CLINICAL DATA:  Chronic, progressively worsening low back pain radiating into the left leg. No prior surgery.   EXAM: MRI LUMBAR SPINE WITHOUT CONTRAST   TECHNIQUE: Multiplanar, multisequence MR imaging of the lumbar spine was performed.  No intravenous contrast was administered.   COMPARISON:  None Available.   FINDINGS: Segmentation: Assumed standard. The last well-formed disc space is designated L5-S1 for the purposes of this report.   Alignment:  Physiologic.   Vertebrae:  No fracture, evidence of discitis, or bone lesion.   Conus medullaris and cauda equina: Conus extends to the L2 level. Conus and cauda equina appear normal.   Paraspinal and other soft tissues: Negative.   Disc levels:   T12-L1 to L2-L3:  Negative.   L3-L4: Negative disc. Mild bilateral facet arthropathy. Mild bilateral neuroforaminal stenosis. No spinal canal stenosis.   L4-L5: Negative disc. Mild bilateral facet arthropathy. Mild bilateral neuroforaminal stenosis. No spinal canal stenosis.   L5-S1: Negative disc. Mild bilateral  facet arthropathy. No stenosis.   IMPRESSION: 1. Mild facet arthropathy of the lower lumbar spine. No high-grade stenosis or impingement.     Electronically Signed   By: Titus Dubin M.D.   On: 10/04/2021 14:01  CLINICAL DATA:  Chronic, progressively worsening neck pain radiating into the left arm. No injury or prior surgery.   EXAM: MRI CERVICAL SPINE WITHOUT CONTRAST   TECHNIQUE: Multiplanar, multisequence MR imaging of the cervical spine was performed. No intravenous contrast was administered.   COMPARISON:  None Available.   FINDINGS: Alignment: Straightening and slight reversal of the normal cervical lordosis. No significant listhesis.   Vertebrae: No fracture, evidence of discitis, or bone lesion. Mild chronic degenerative endplate fatty marrow signal changes posteriorly at C4-C5.   Cord: Normal signal and morphology.   Posterior Fossa, vertebral arteries, paraspinal tissues: Negative.   Disc levels:   C2-C3:  Negative.   C3-C4:  Negative.   C4-C5: Tiny central posterior disc osteophyte complex. No stenosis.   C5-C6: Tiny broad-based posterior disc osteophyte complex  with superimposed small left paracentral disc protrusion. No stenosis.   C6-C7:  Negative.   C7-T1:  Negative.   IMPRESSION: 1. Minimal degenerative disc disease at C4-C5 and C5-C6. No stenosis or impingement.     Electronically Signed   By: Titus Dubin M.D.   On: 10/04/2021 13:55  PATIENT SURVEYS:  FOTO ***  SCREENING FOR RED FLAGS: Negative  COGNITION: Overall cognitive status: Within functional limits for tasks assessed     SENSATION: Not tested  MUSCLE LENGTH: Hamstrings: Right *** deg; Left *** deg Marcello Moores test: Right *** deg; Left *** deg  POSTURE: {posture:25561}  PALPATION: ***  LUMBAR ROM:   AROM eval  Flexion   Extension   Right lateral flexion   Left lateral flexion   Right rotation   Left rotation    (Blank rows = not tested)  LOWER EXTREMITY ROM:     {AROM/PROM:27142}  Right eval Left eval  Hip flexion    Hip extension    Hip abduction    Hip adduction    Hip internal rotation    Hip external rotation    Knee flexion    Knee extension    Ankle dorsiflexion    Ankle plantarflexion    Ankle inversion    Ankle eversion     (Blank rows = not tested)  LOWER EXTREMITY MMT:    MMT Right eval Left eval  Hip flexion    Hip extension    Hip abduction    Hip adduction    Hip internal rotation    Hip external rotation    Knee flexion    Knee extension    Ankle dorsiflexion    Ankle plantarflexion    Ankle inversion    Ankle eversion     (Blank rows = not tested)  LUMBAR SPECIAL TESTS:  {lumbar special test:25242}  FUNCTIONAL TESTS:  {Functional tests:24029}  GAIT: Distance walked: *** Assistive device utilized: {Assistive devices:23999} Level of assistance: {Levels of assistance:24026} Comments: ***  TODAY'S TREATMENT:  DATE: ***    PATIENT EDUCATION:  Education details: Discussed eval  findings, rehab rationale and POC and patient is in agreement  Person educated: Patient Education method: Explanation Education comprehension: verbalized understanding and needs further education  HOME EXERCISE PROGRAM: ***  ASSESSMENT:  CLINICAL IMPRESSION: Patient is a *** y.o. *** who was seen today for physical therapy evaluation and treatment for ***.   OBJECTIVE IMPAIRMENTS: {opptimpairments:25111}.   ACTIVITY LIMITATIONS: {activitylimitations:27494}  PARTICIPATION LIMITATIONS: {participationrestrictions:25113}  PERSONAL FACTORS: {Personal factors:25162} are also affecting patient's functional outcome.   REHAB POTENTIAL: {rehabpotential:25112}  CLINICAL DECISION MAKING: {clinical decision making:25114}  EVALUATION COMPLEXITY: {Evaluation complexity:25115}   GOALS: Goals reviewed with patient? {yes/no:20286}  SHORT TERM GOALS: Target date: {follow up:25551}  *** Baseline: Goal status: {GOALSTATUS:25110}  2.  *** Baseline:  Goal status: {GOALSTATUS:25110}  3.  *** Baseline:  Goal status: {GOALSTATUS:25110}  4.  *** Baseline:  Goal status: {GOALSTATUS:25110}  5.  *** Baseline:  Goal status: {GOALSTATUS:25110}  6.  *** Baseline:  Goal status: {GOALSTATUS:25110}  LONG TERM GOALS: Target date: {follow up:25551}  *** Baseline:  Goal status: {GOALSTATUS:25110}  2.  *** Baseline:  Goal status: {GOALSTATUS:25110}  3.  *** Baseline:  Goal status: {GOALSTATUS:25110}  4.  *** Baseline:  Goal status: {GOALSTATUS:25110}  5.  *** Baseline:  Goal status: {GOALSTATUS:25110}  6.  *** Baseline:  Goal status: {GOALSTATUS:25110}  PLAN:  PT FREQUENCY: {rehab frequency:25116}  PT DURATION: {rehab duration:25117}  PLANNED INTERVENTIONS: {rehab planned interventions:25118::"Therapeutic exercises","Therapeutic activity","Neuromuscular re-education","Balance training","Gait training","Patient/Family education","Self Care","Joint  mobilization"}.  PLAN FOR NEXT SESSION: ***   Lanice Shirts, PT 11/12/2021, 11:09 AM

## 2021-11-15 ENCOUNTER — Ambulatory Visit: Payer: No Typology Code available for payment source

## 2021-11-24 ENCOUNTER — Ambulatory Visit: Payer: Federal, State, Local not specified - PPO | Attending: Nurse Practitioner | Admitting: Physical Therapy

## 2021-11-24 NOTE — Therapy (Incomplete)
OUTPATIENT PHYSICAL THERAPY CERVICAL EVALUATION   Patient Name: Meghan Orr MRN: [id number] DOB:September 20, 1970, 51 y.o., female Today's Date: 11/24/2021    Past Medical History:  Diagnosis Date   Anxiety    Arthritis    Depression    Headache    Pre-diabetes    Past Surgical History:  Procedure Laterality Date   HEMICOLECTOMY Right 2002   SHOULDER ARTHROSCOPY Right    UPPER GI ENDOSCOPY N/A 12/16/2019   Procedure: UPPER GI ENDOSCOPY;  Surgeon: Gaynelle Adu, MD;  Location: [facility];  Service: General;  Laterality: N/A;   Patient Active Problem List   Diagnosis Date Noted   Severe obesity (HCC) 12/16/2019   Prediabetes 12/16/2019   Osteoarthritis of right hip 12/16/2019   History of migraine headaches 12/16/2019   S/P laparoscopic sleeve gastrectomy 12/16/2019    PCP: NA   REFERRING PROVIDER: Janeece Riggers, FNP  REFERRING DIAG: Radiculopathy, cervical region  THERAPY DIAG:  No diagnosis found.  Rationale for Evaluation and Treatment: Rehabilitation  ONSET DATE: ***   SUBJECTIVE SUBJECTIVE STATEMENT: ***  PERTINENT HISTORY:  ***  PAIN:  Are you having pain? Yes:  NPRS scale: ***/10 Pain location: *** Pain description: *** Aggravating factors: *** Relieving factors: ***  PRECAUTIONS: None  WEIGHT BEARING RESTRICTIONS: No  FALLS:  Has patient fallen in last 6 months? {fallsyesno:27318}  OCCUPATION: ***  PLOF: Independent  PATIENT GOALS: Pain relief and ***   OBJECTIVE:  DIAGNOSTIC FINDINGS:  Cervical MRI 10/03/2021: 1. Minimal degenerative disc disease at C4-C5 and C5-C6. No stenosis or impingement.  Lumbar MRI 10/03/2021: 1. Mild facet arthropathy of the lower lumbar spine. No high-grade stenosis or impingement.  PATIENT SURVEYS:  FOTO ***% functional status  COGNITION: Overall cognitive status: Within functional limits for tasks assessed  SENSATION: {sensation:27233}  POSTURE:   {posture:25561}  PALPATION: ***   CERVICAL ROM:   Active ROM A/PROM (deg) eval  Flexion   Extension   Right lateral flexion   Left lateral flexion   Right rotation   Left rotation    (Blank rows = not tested)  LUMBAR ROM:   Active  A/PROM  eval  Flexion   Extension   Right lateral flexion   Left lateral flexion   Right rotation   Left rotation    (Blank rows = not tested)   UPPER EXTREMITY ROM:  {AROM/PROM:27142} ROM Right eval Left eval  Shoulder flexion    Shoulder extension    Shoulder abduction    Shoulder adduction    Shoulder extension    Shoulder internal rotation    Shoulder external rotation    Elbow flexion    Elbow extension    Wrist flexion    Wrist extension    Wrist ulnar deviation    Wrist radial deviation    Wrist pronation    Wrist supination     (Blank rows = not tested)  UPPER EXTREMITY MMT:  MMT Right eval Left eval  Shoulder flexion    Shoulder extension    Shoulder abduction    Shoulder adduction    Shoulder extension    Shoulder internal rotation    Shoulder external rotation    Middle trapezius    Lower trapezius    Elbow flexion    Elbow extension    Wrist flexion    Wrist extension    Wrist ulnar deviation    Wrist radial deviation    Wrist pronation    Wrist supination    Grip strength     (  Blank rows = not tested)  LOWER EXTREMITY MMT:    MMT Right eval Left eval  Hip flexion    Hip extension    Hip abduction    Hip adduction    Hip internal rotation    Hip external rotation    Knee flexion    Knee extension    Ankle dorsiflexion    Ankle plantarflexion    Ankle inversion    Ankle eversion     (Blank rows = not tested)   CERVICAL SPECIAL TESTS:  {Cervical special tests:25246}  FUNCTIONAL TESTS:  {Functional tests:24029}   TODAY'S TREATMENT [facility] Adult PT Treatment:                                                DATE: 11/24/2021 Therapeutic Exercise: ***  PATIENT EDUCATION:   Education details: Exam findings, POC, HEP Person educated: Patient Education method: Explanation, Demonstration, Tactile cues, Verbal cues, and Handouts Education comprehension: verbalized understanding, returned demonstration, verbal cues required, tactile cues required, and needs further education  HOME EXERCISE PROGRAM: ***   ASSESSMENT: CLINICAL IMPRESSION: Patient is a 51 y.o. female who was seen today for physical therapy evaluation and treatment for ***.    OBJECTIVE IMPAIRMENTS: {opptimpairments:25111}.   ACTIVITY LIMITATIONS: {activitylimitations:27494}  PARTICIPATION LIMITATIONS: {participationrestrictions:25113}  PERSONAL FACTORS: {Personal factors:25162} are also affecting patient's functional outcome.   REHAB POTENTIAL: {rehabpotential:25112}  CLINICAL DECISION MAKING: {clinical decision making:25114}  EVALUATION COMPLEXITY: {Evaluation complexity:25115}   GOALS: Goals reviewed with patient? Yes  SHORT TERM GOALS: Target date: {follow up:25551}   Patient will be I with initial HEP in order to progress with therapy. Baseline: HEP provided at eval Goal status: INITIAL  2.  PT will review FOTO with patient by 3rd visit in order to understand expected progress and outcome with therapy. Baseline: FOTO assessed at eval Goal status: INITIAL  3.  *** Baseline: *** Goal status: INITIAL  LONG TERM GOALS: Target date: {follow up:25551}  Patient will be I with final HEP to maintain progress from PT. Baseline: HEP provided at eval Goal status: INITIAL  2.  Patient will report >/= ***% status on FOTO to indicate improved functional ability. Baseline: ***% functional status Goal status: INITIAL  3.  *** Baseline: *** Goal status: INITIAL  4.  *** Baseline: *** Goal status: INITIAL   PLAN: PT FREQUENCY: {rehab frequency:25116}  PT DURATION: {rehab duration:25117}  PLANNED INTERVENTIONS: {rehab planned interventions:25118::"Therapeutic  exercises","Therapeutic activity","Neuromuscular re-education","Balance training","Gait training","Patient/Family education","Self Care","Joint mobilization"}  PLAN FOR NEXT SESSION: Review HEP and progress PRN, ***   Hilda Blades, PT, DPT, LAT, ATC 11/24/21  8:09 AM Phone: [phone] Fax: [phone]

## 2022-04-18 ENCOUNTER — Encounter: Payer: Federal, State, Local not specified - PPO | Attending: General Surgery | Admitting: Skilled Nursing Facility1

## 2022-04-18 ENCOUNTER — Encounter: Payer: Self-pay | Admitting: Skilled Nursing Facility1

## 2022-04-18 VITALS — Ht 67.5 in | Wt 263.0 lb

## 2022-04-18 DIAGNOSIS — Z713 Dietary counseling and surveillance: Secondary | ICD-10-CM | POA: Diagnosis not present

## 2022-04-18 DIAGNOSIS — E669 Obesity, unspecified: Secondary | ICD-10-CM

## 2022-04-18 DIAGNOSIS — R7303 Prediabetes: Secondary | ICD-10-CM | POA: Diagnosis not present

## 2022-04-18 NOTE — Progress Notes (Signed)
Bariatric Nutrition Follow-Up Visit Medical Nutrition Therapy   Post-Operative Sleeve 12/16/2019  NUTRITION ASSESSMENT  Anthropometrics  Start weight at [facility]: 351.7 lbs (date: 02/13/2020) Today's weight: 263 pounds  Body Composition Scale 02/13/2020 04/18/2022  Weight  lbs 315.7 263  Total Body Fat  % 48.9 45.8     Visceral Fat 20 15  Fat-Free Mass  % 51 54.1     Total Body Water  % 40 41.5     Muscle-Mass  lbs 34.6 32.8  BMI 49.2 40.4  Body Fat Displacement ---         Torso  lbs 95.9 74.7        Left Leg  lbs 19.1 14.9        Right Leg  lbs 19.1 14.9        Left Arm  lbs 9.5 7.4        Right Arm  lbs 9.5 7.4   Clinical  Medical hx: prediabetes, migraines Medications: Ozempic Labs:    Lifestyle & Dietary Hx  Pt states she has been getting indigestion. Pt states she thinks she got off the pattern of proper eating. Pt states she pays someone to clean her home and makes her meals. Pt states she is not sure what he adds to her foods for flavoring.  Pt does not report any vomiting. Pt states she feels like she is not eating enough stating she can only fit a few:4-5 bites: dietitian advised pt to log and measure out her foods as that is difficult to know if she is not measuring.  Pt reports no chest pressure or coughing but some burning in her chest.  Pt states she continues with migraines a few times a month.   Estimated daily fluid intake: 64 oz Estimated daily protein intake: 70+ g Supplements: inappropriate multi and calcium Current average weekly physical activity: some walking but has knee pain so limited   24-Hr Dietary Recall First Meal: coffee and water and breakfast burrito + egg + sausage  Snack:  Second Meal: cream of mushroom soup + wild rice and chicken Snack:  Third Meal: chicken and asparagus and carrots or potatoes and rice or fried cabbage Snack:  Beverages: water + flavorings, regular coffee + flavored creamer  Post-Op Goals/ Signs/ Symptoms Using  straws: no Drinking while eating: no Chewing/swallowing difficulties: no Changes in vision: no Changes to mood/headaches: no Hair loss/changes to skin/nails: no Difficulty focusing/concentrating: no Sweating: no Dizziness/lightheadedness: no Palpitations: no  Carbonated/caffeinated beverages: no N/V/D/C/Gas: no Abdominal pain: no Dumping syndrome: no    NUTRITION DIAGNOSIS  Overweight/obesity ([place]-3.3) related to past poor dietary habits and physical inactivity as evidenced by completed bariatric surgery and following dietary guidelines for continued weight loss and healthy nutrition status.     NUTRITION INTERVENTION Nutrition counseling (C-1) and education (E-2) to facilitate bariatric surgery goals, including: The importance of consuming adequate calories as well as certain nutrients daily due to the body's need for essential vitamins, minerals, and fats The importance of daily physical activity and to reach a goal of at least 150 minutes of moderate to vigorous physical activity weekly (or as directed by their physician) due to benefits such as increased musculature and improved lab values The importance of intuitive eating specifically learning hunger-satiety cues and understanding the importance of learning a new body: The importance of mindful eating to avoid grazing behaviors  Importance of non starchy vegetables To have an overall healthy diet, adult men and women are recommended to consume anywhere  from 2-3 cups of vegetables daily. Vegetables provide a wide range of vitamins and minerals such as vitamin A, vitamin C, potassium, and folic acid. According to the [employer], including fruit and vegetables daily may reduce the risk of cardiovascular disease, certain cancers, and other non-communicable diseases Avoidance of grazing behaviors  Creation of balanced and diverse meals to increase the intake of nutrient-rich foods that provide essential vitamins, minerals,  fiber, and phytonutrients  Variety of Fruits and Vegetables:  Aim for a colorful array of fruits and vegetables to ensure a wide range of nutrients. Include a mix of leafy greens, berries, citrus fruits, cruciferous vegetables, and more. Whole Grains: Choose whole grains over refined grains. Examples include brown rice, quinoa, oats, whole wheat, and barley. Lean Proteins: Include lean sources of protein, such as poultry, fish, tofu, legumes, beans, lentils, and low-fat dairy products. Limit red and processed meats. Healthy Fats: Incorporate sources of healthy fats, including avocados, nuts, seeds, and olive oil. Limit saturated and trans fats found in fried and processed foods. Dairy or Dairy Alternatives: Choose low-fat or fat-free dairy products, or plant-based alternatives like almond or soy milk. Portion Control: Be mindful of portion sizes to avoid overeating. Pay attention to hunger and satisfaction cues. Limit Added Sugars: Minimize the consumption of sugary beverages, snacks, and desserts. Check food labels for added sugars and opt for natural sources of sweetness such as whole fruits. Hydration: Drink plenty of water throughout the day. Limit sugary drinks and excessive caffeine intake. Moderate Sodium Intake: Reduce the consumption of high-sodium foods. Use herbs and spices for flavor instead of excessive salt. Meal Planning and Preparation: Plan and prepare meals ahead of time to make healthier choices more convenient. Include a mix of food groups in each meal. Limit Processed Foods: Minimize the intake of highly processed and packaged foods that are often high in added sugars, salt, and unhealthy fats. Regular Physical Activity: Combine a healthy diet with regular physical activity for overall well-being. Aim for at least 150 minutes of moderate-intensity aerobic exercise per week, along with strength training. Moderation and Balance: Enjoy treats and indulgent foods in  moderation, emphasizing balance rather than strict restriction.  Goals: -Get back in tune with your satisfaction cues  -stop eating anything including snacks within 3 hours of bed -avoid drinking within 30 minutes of a meal before or after -try alkaline water pH 8.8 -avoid caffeinated coffee; decaf should be okay  -try to move as much as able  -be sure to chew very well  -log everything you eat and measure it out  -ask your Tresa Moore if he uses peppers, onion, or garlic to flavor the food as it can exacerbate reflux  -let Dr. Redmond Pulling know if the Ozempic makes the reflux worse    Learning Style & Readiness for Change Teaching method utilized: Visual & Auditory  Demonstrated degree of understanding via: Teach Back  Readiness Level: Action Barriers to learning/adherence to lifestyle change: unidentified   RD's Notes for Next Visit Assess adherence to pt chosen goals   MONITORING & EVALUATION Dietary intake, weekly physical activity, body weight  Next Steps Patient is to follow-up as needed: please call or email with any future questions or concerns

## 2023-04-10 ENCOUNTER — Ambulatory Visit: Payer: No Typology Code available for payment source

## 2023-04-10 ENCOUNTER — Telehealth: Payer: Self-pay | Admitting: *Deleted

## 2023-04-10 NOTE — Telephone Encounter (Signed)
 Attempt to reach pt for pre-visit.  VM full   Will attempt to reach again in 5 min due to no other # listed in profile  Second attempt to reach pt for pre-vist unsuccessful.VM full

## 2023-04-10 NOTE — Progress Notes (Unsigned)
 Pt's name and DOB verified at the beginning of the pre-visit wit 2 identifiers  Permission given to speak with  Pt denies any difficulty with ambulating,sitting, laying down or rolling side to side  Pt has no issues with ambulation   Pt has no issues moving head neck or swallowing  No egg or soy allergy known to patient   No issues known to pt with past sedation with any surgeries or procedures  Pt denies having issues being intubated  Patient denies ever being intubated  No FH of Malignant Hyperthermia  Pt is not on diet pills or shots  Pt is not on home 02   Pt is not on blood thinners   Pt denies issues with constipation   Pt has frequent issues with constipation RN instructed pt to use Miralax per bottles instructions a week before prep days. Pt states they will  Pt is not on dialysis  Pt denise any abnormal heart rhythms   Pt denies any upcoming cardiac testing  Patient's chart reviewed by Cathlyn Parsons CNRA prior to pre-visit and patient appropriate for the [facility].  Pre-visit completed and red dot placed by patient's name on their procedure day (on provider's schedule).    Chart not reviewed by CRNA prior to [facility]  Visit by phone  Pt states weight is   IInstructions reviewed. Pt given [facility], [facility] main # and MD on call # prior to instructions.  Pt states understanding of instructions. Instructed to review again prior to procedure. Pt states they will.   Informed pt that they will receive a text or  call from [facility] regarding there prep med.

## 2023-04-12 ENCOUNTER — Ambulatory Visit (AMBULATORY_SURGERY_CENTER)

## 2023-04-12 ENCOUNTER — Other Ambulatory Visit: Payer: Self-pay

## 2023-04-12 VITALS — Ht 67.5 in | Wt 255.0 lb

## 2023-04-12 DIAGNOSIS — Z1211 Encounter for screening for malignant neoplasm of colon: Secondary | ICD-10-CM

## 2023-04-12 IMAGING — CR DG THORACIC SPINE 3V
3 series · 3 of 3 positions shown · non-contrast
Comparison: None Available.

CLINICAL DATA: Worsening atraumatic midthoracic pain.

EXAM:
THORACIC SPINE - 3 VIEWS

[w thoracic spine ap]
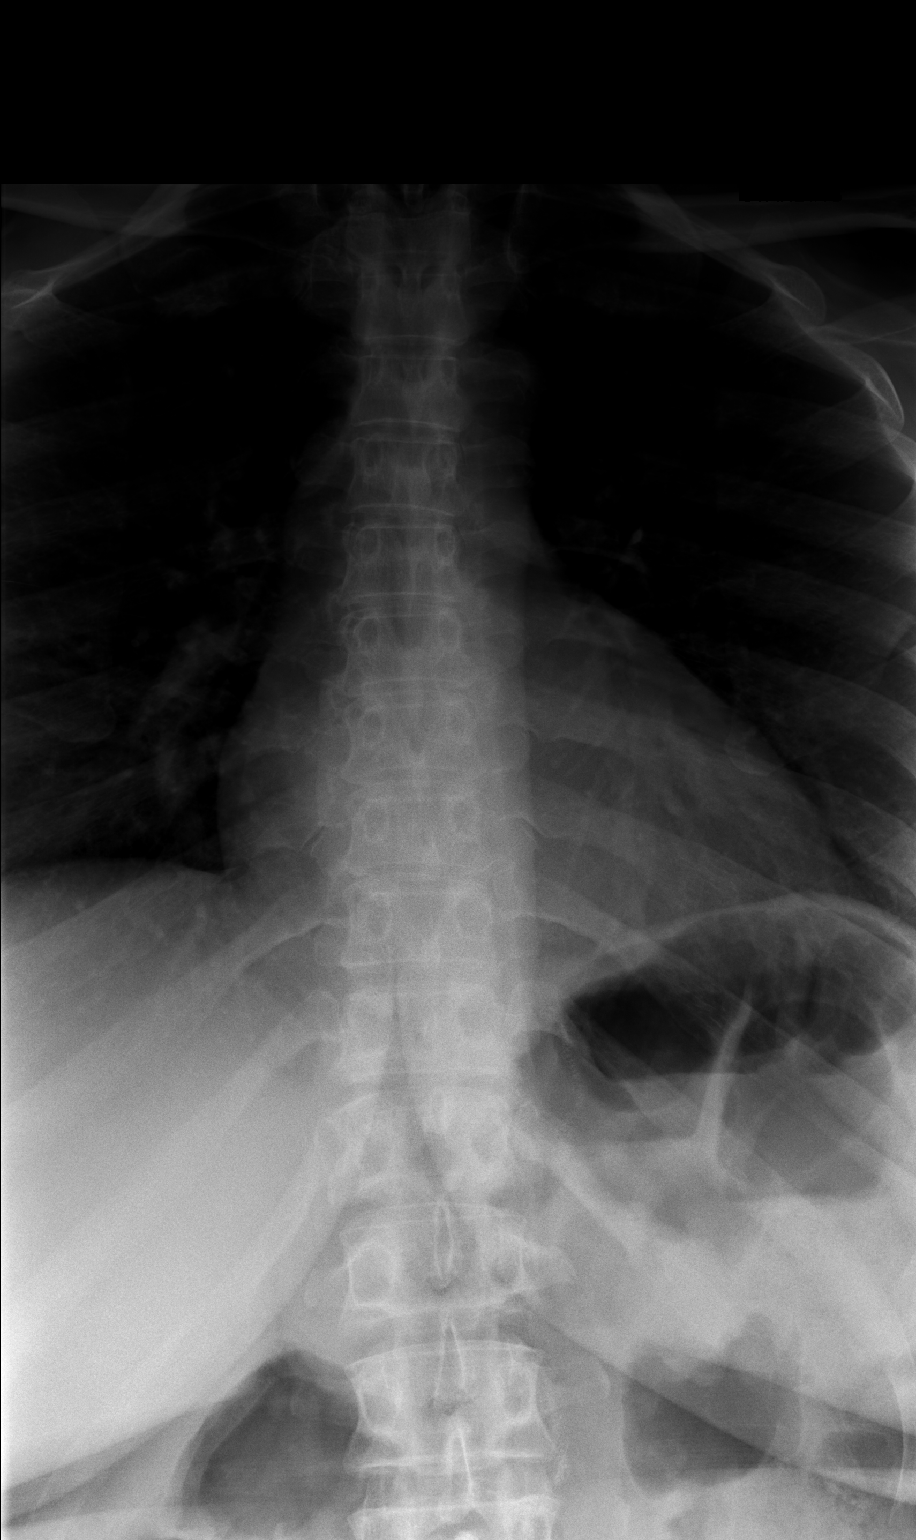

[w thoracic spine lat]
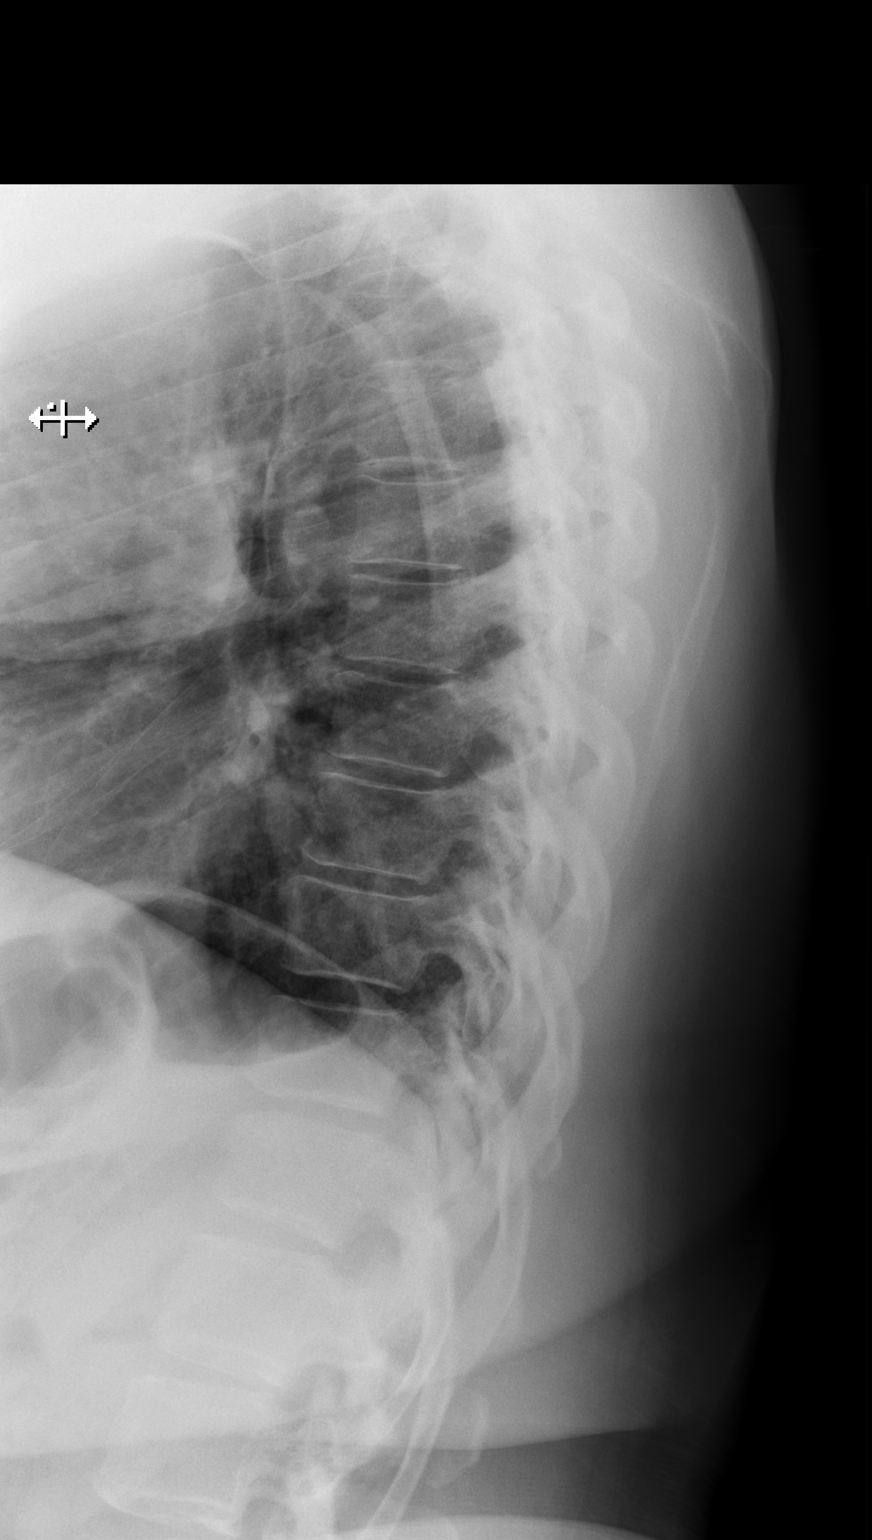

[w thoracic swimmers]
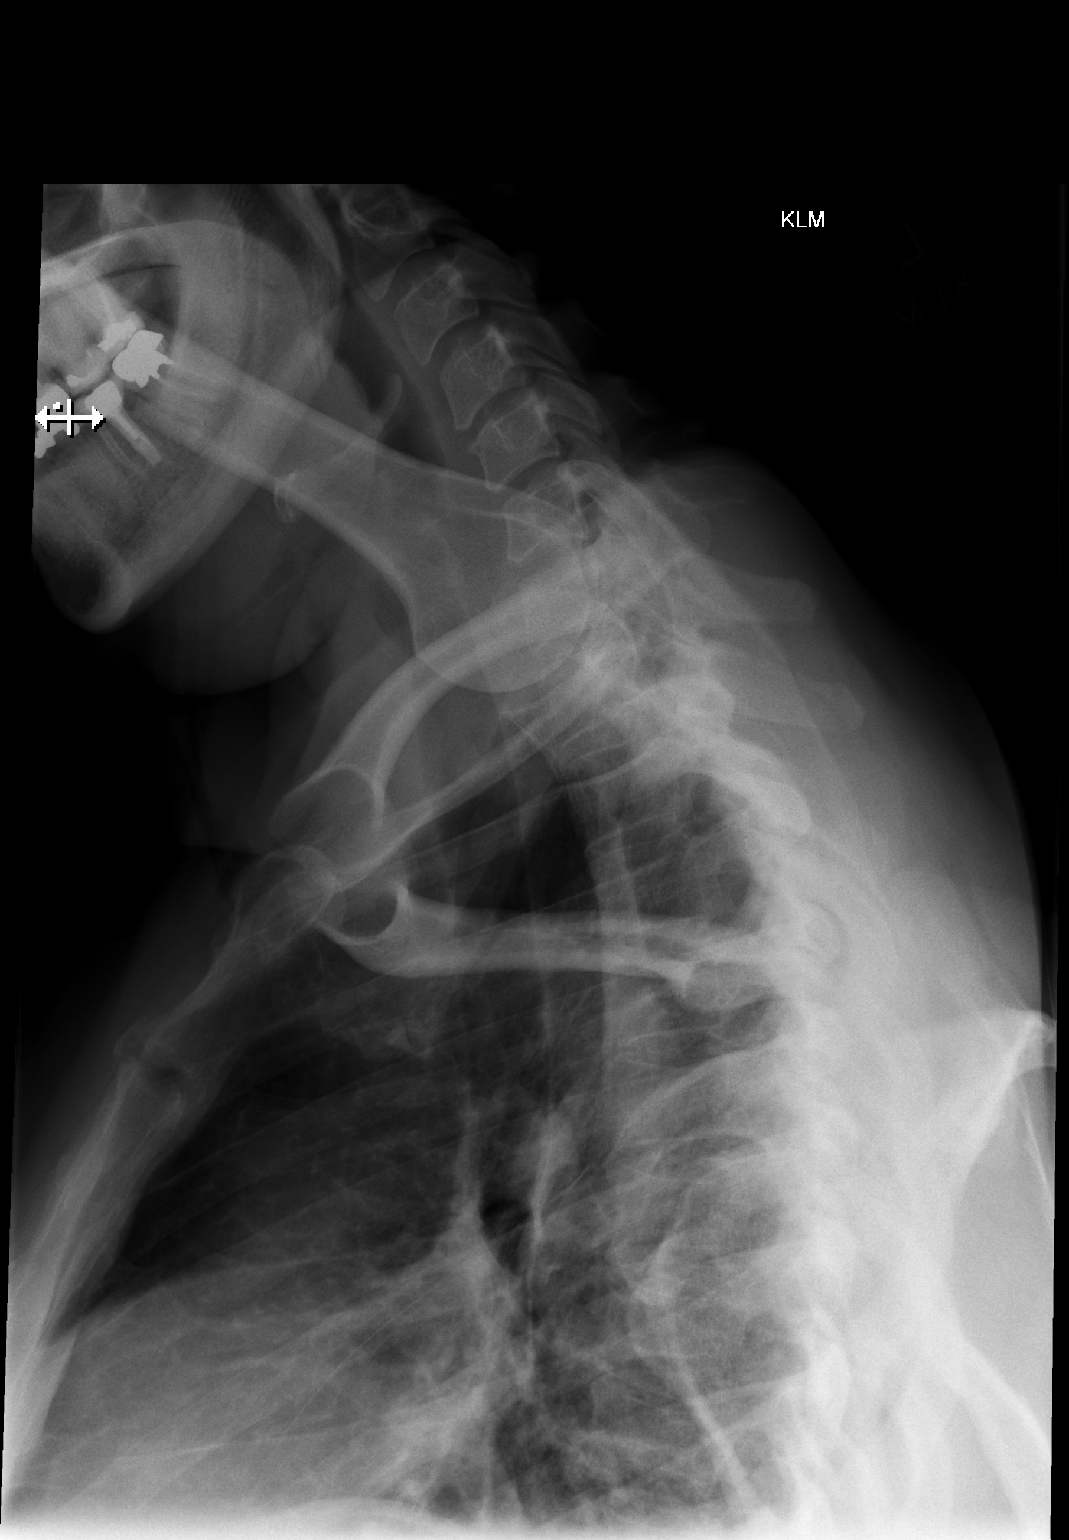

[3 of 3 positions shown; findings below may reference images not displayed]

FINDINGS: There is no evidence of thoracic spine fracture. Alignment is
normal. No other significant bone abnormalities are identified.
IMPRESSION: Negative.

## 2023-04-12 MED ORDER — PEG 3350-KCL-NA BICARB-NACL 420 G PO SOLR: 4000.0000 mL | Freq: Once | ORAL | 0 refills | Status: AC

## 2023-04-12 NOTE — Progress Notes (Signed)
 Denies allergies to eggs or soy products. Denies complication of anesthesia or sedation. Denies use of weight loss medication. Denies use of O2.   Emmi instructions given for colonoscopy.

## 2023-04-27 ENCOUNTER — Encounter: Payer: Self-pay | Admitting: Certified Registered Nurse Anesthetist

## 2023-05-01 ENCOUNTER — Telehealth: Payer: Self-pay | Admitting: *Deleted

## 2023-05-01 ENCOUNTER — Encounter: Payer: No Typology Code available for payment source | Admitting: Internal Medicine

## 2023-05-01 ENCOUNTER — Telehealth: Payer: Self-pay | Admitting: Internal Medicine

## 2023-05-01 ENCOUNTER — Encounter: Payer: Self-pay | Admitting: *Deleted

## 2023-05-01 DIAGNOSIS — Z1211 Encounter for screening for malignant neoplasm of colon: Secondary | ICD-10-CM

## 2023-05-01 MED ORDER — NA SULFATE-K SULFATE-MG SULF 17.5-3.13-1.6 GM/177ML PO SOLN: 1.0000 | Freq: Once | ORAL | 0 refills | Status: AC

## 2023-05-01 MED ORDER — PEG 3350-KCL-NA BICARB-NACL 420 G PO SOLR: 4000.0000 mL | Freq: Once | ORAL | 0 refills | Status: AC

## 2023-05-01 NOTE — Addendum Note (Signed)
 Addended by: Corbin Ade L on: 05/01/2023 11:19 AM   Modules accepted: Orders

## 2023-05-01 NOTE — Telephone Encounter (Signed)
error 

## 2023-05-01 NOTE — Telephone Encounter (Signed)
 [facility] called and stated that she was wondering if we could switch this patient SuPrep for Golytely due to them not having in there pharmacy Su Prep. Please advise.

## 2023-05-01 NOTE — Telephone Encounter (Signed)
 Pt called states she has been unable to finish morning dose of prep due to gastric sleeve surgery and unable to drink full amount.Also reports stools are still dark brown. Discussed options and pt decided to reschedule for 06/13/23. Will have pt do a two day prep. Instructed her that she could start earlier than times on instructions for drinking as long as she is NPO 3 hours before her procedure time. Prescription for Supep sent to [facility] pharmacy per pt request. New instructions sent via MyChart.

## 2023-05-01 NOTE — Telephone Encounter (Signed)
 New set of instructions sent to My Chart and will be sent via mail due to date change of procedure., Golytely prep orders snet to [place]. Pt called and made aware of changes in prep and med and where to find instructions and that hard copy coming in the mail. Instructed pt to call if she has any questions # given.

## 2023-05-01 NOTE — Telephone Encounter (Signed)
 Patient needing to be further advised on prep  states she is not able to drink entire prep solution.

## 2023-05-10 ENCOUNTER — Telehealth: Payer: Self-pay | Admitting: Internal Medicine

## 2023-05-10 NOTE — Telephone Encounter (Signed)
     [facility] with [facility] to return call to verify patient has been sent a prep and which one so instructions can be update.  Attempted patient to see if prep had been sent to her , wanting to know what the prep is so we may send updated instructions. Requested a call back from the patient.

## 2023-05-10 NOTE — Telephone Encounter (Signed)
 Inbound call from [facility] pharmacy stating they have received a new prescription for Suprep. Requesting to know if they can cancel order due to patient previously receiving prep medication that is in their formulary. Call back number is [phone] ext [phone]. Please advise, thank you.

## 2023-06-13 ENCOUNTER — Ambulatory Visit (AMBULATORY_SURGERY_CENTER): Admitting: Internal Medicine

## 2023-06-13 ENCOUNTER — Encounter: Payer: Self-pay | Admitting: Internal Medicine

## 2023-06-13 VITALS — BP 140/65 | HR 68 | Temp 97.9°F | Resp 19 | Ht 67.5 in | Wt 255.0 lb

## 2023-06-13 DIAGNOSIS — K648 Other hemorrhoids: Secondary | ICD-10-CM | POA: Diagnosis not present

## 2023-06-13 DIAGNOSIS — Z980 Intestinal bypass and anastomosis status: Secondary | ICD-10-CM | POA: Diagnosis not present

## 2023-06-13 DIAGNOSIS — Z1211 Encounter for screening for malignant neoplasm of colon: Secondary | ICD-10-CM

## 2023-06-13 MED ORDER — SODIUM CHLORIDE 0.9 % IV SOLN: 500.0000 mL | Freq: Once | INTRAVENOUS | Status: DC

## 2023-06-13 NOTE — Op Note (Signed)
 [facility] Patient Name: Meghan Orr Procedure Date: 06/13/2023 2:07 PM MRN: [id number] Endoscopist: Freada Jacobs [place] , , [id number] Age: 53 Referring MD:  Date of Birth: Apr 24, 1970 Gender: Female Account #: [id number] Procedure:                Colonoscopy Indications:              Screening for colorectal malignant neoplasm Medicines:                Monitored Anesthesia Care Procedure:                Pre-Anesthesia Assessment:                           - Prior to the procedure, a History and Physical                            was performed, and patient medications and                            allergies were reviewed. The patient's tolerance of                            previous anesthesia was also reviewed. The risks                            and benefits of the procedure and the sedation                            options and risks were discussed with the patient.                            All questions were answered, and informed consent                            was obtained. Prior Anticoagulants: The patient has                            taken no anticoagulant or antiplatelet agents. ASA                            Grade Assessment: III - A patient with severe                            systemic disease. After reviewing the risks and                            benefits, the patient was deemed in satisfactory                            condition to undergo the procedure.                           After obtaining informed consent, the colonoscope  was passed under direct vision. Throughout the                            procedure, the patient's blood pressure, pulse, and                            oxygen saturations were monitored continuously. The                            CF [id number] #[id number] was introduced through the anus                            and advanced to the the terminal ileum. The                            colonoscopy  was performed without difficulty. The                            patient tolerated the procedure well. The quality                            of the bowel preparation was adequate. The terminal                            ileum and the rectum were photographed. Scope In: 2:37:13 PM Scope Out: 2:57:40 PM Scope Withdrawal Time: 0 hours 14 minutes 22 seconds  Total Procedure Duration: 0 hours 20 minutes 27 seconds  Findings:                 The terminal ileum appeared normal.                           There was evidence of a prior side-to-side                            ileo-colonic anastomosis in the transverse colon.                            This was patent and was characterized by healthy                            appearing mucosa. The anastomosis was traversed.                           Non-bleeding internal hemorrhoids were found during                            retroflexion. Complications:            No immediate complications. Estimated Blood Loss:     Estimated blood loss: none. Impression:               - The examined portion of the ileum was normal.                           -  Patent side-to-side ileo-colonic anastomosis,                            characterized by healthy appearing mucosa.                           - Non-bleeding internal hemorrhoids.                           - No specimens collected. Recommendation:           - Discharge patient to home (with escort).                           - Repeat colonoscopy in 10 years for screening                            purposes.                           - The findings and recommendations were discussed                            with the patient. Dr Pedro Bourgeois "Anastacio Balm" Rosaline Coma,  06/13/2023 3:03:56 PM

## 2023-06-13 NOTE — Progress Notes (Signed)
 Report to PACU, RN, vss, BBS= Clear.

## 2023-06-13 NOTE — Patient Instructions (Signed)
 Resume all of your previous medications today as ordered. Read your handout about hemorrhoids. Read you discharge instructions.   YOU HAD AN ENDOSCOPIC PROCEDURE TODAY AT THE [facility]:   Refer to the procedure report that was given to you for any specific questions about what was found during the examination.  If the procedure report does not answer your questions, please call your gastroenterologist to clarify.  If you requested that your care partner not be given the details of your procedure findings, then the procedure report has been included in a sealed envelope for you to review at your convenience later.  YOU SHOULD EXPECT: Some feelings of bloating in the abdomen. Passage of more gas than usual.  Walking can help get rid of the air that was put into your GI tract during the procedure and reduce the bloating. If you had a lower endoscopy (such as a colonoscopy or flexible sigmoidoscopy) you may notice spotting of blood in your stool or on the toilet paper. If you underwent a bowel prep for your procedure, you may not have a normal bowel movement for a few days.  Please Note:  You might notice some irritation and congestion in your nose or some drainage.  This is from the oxygen used during your procedure.  There is no need for concern and it should clear up in a day or so.  SYMPTOMS TO REPORT IMMEDIATELY:  Following lower endoscopy (colonoscopy or flexible sigmoidoscopy):  Excessive amounts of blood in the stool  Significant tenderness or worsening of abdominal pains  Swelling of the abdomen that is new, acute  Fever of 100F or higher  For urgent or emergent issues, a gastroenterologist can be reached at any hour by calling [phone]. Do not use MyChart messaging for urgent concerns.    DIET:  We do recommend a small meal at first, but then you may proceed to your regular diet.  Drink plenty of fluids but you should avoid alcoholic beverages for 24  hours.  ACTIVITY:  You should plan to take it easy for the rest of today and you should NOT DRIVE or use heavy machinery until tomorrow (because of the sedation medicines used during the test).    FOLLOW UP: Our staff will call the number listed on your records the next business day following your procedure.  We will call around 7:15- 8:00 am to check on you and address any questions or concerns that you may have regarding the information given to you following your procedure. If we do not reach you, we will leave a message.       SIGNATURES/CONFIDENTIALITY: You and/or your care partner have signed paperwork which will be entered into your electronic medical record.  These signatures attest to the fact that that the information above on your After Visit Summary has been reviewed and is understood.  Full responsibility of the confidentiality of this discharge information lies with you and/or your care-partner.

## 2023-06-13 NOTE — Progress Notes (Signed)
 GASTROENTEROLOGY PROCEDURE H&P NOTE   Primary Care Physician: [facility]    Reason for Procedure:   Colon cancer screening  Plan:    Colonoscopy  Patient is appropriate for endoscopic procedure(s) in the ambulatory ([facility]) setting.  The nature of the procedure, as well as the risks, benefits, and alternatives were carefully and thoroughly reviewed with the patient. Ample time for discussion and questions allowed. The patient understood, was satisfied, and agreed to proceed.     HPI: Meghan Orr is a 53 y.o. female who presents for colonoscopy for colon cancer screening. Denies blood in stools, changes in bowel habits, or unintentional weight loss. Denies family history of colon cancer. Her last colonoscopy was 10 years ago and was normal. 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   Past Medical History:  Diagnosis Date   Anxiety    Arthritis    Depression    GERD (gastroesophageal reflux disease)    Headache    Neuromuscular disorder (HCC)    Pre-diabetes     Past Surgical History:  Procedure Laterality Date   COLON RESECTION  2002   HEMICOLECTOMY Right 2002   LAPAROSCOPIC GASTRIC SLEEVE RESECTION N/A    SHOULDER ARTHROSCOPY Right    UPPER GI ENDOSCOPY N/A 12/16/2019   Procedure: UPPER GI ENDOSCOPY;  Surgeon: Aldean Hummingbird, MD;  Location: [facility];  Service: General;  Laterality: N/A;    Prior to Admission medications   Medication Sig Start Date End Date Taking? Authorizing Provider  ASHWAGANDHA PO Take 1 tablet by mouth daily.    [provider]  diclofenac Sodium (VOLTAREN) 1 % GEL Apply 2 g topically 3 (three) times daily as needed (back pain).    [provider]  DULoxetine (CYMBALTA) 60 MG capsule Take 60 mg by mouth daily.    [provider]  fluticasone  (FLONASE ) 50 MCG/ACT nasal spray Place 1-2 sprays into both nostrils daily. 08/06/20   Wieters, Hallie C, PA-C  hydrOXYzine   (ATARAX /VISTARIL ) 10 MG tablet Take 10 mg by mouth 3 (three) times daily as needed for anxiety.     [provider]  Multiple Vitamin (MULTIVITAMIN WITH MINERALS) TABS tablet Take 1 tablet by mouth daily.    [provider]  pantoprazole  (PROTONIX ) 40 MG tablet Take 1 tablet (40 mg total) by mouth daily. 12/17/19   Aldean Hummingbird, MD  prazosin  (MINIPRESS ) 2 MG capsule Take 2 mg by mouth at bedtime.    [provider]  Rimegepant Sulfate (NURTEC PO) Take 70 mg by mouth as needed.    [provider]  topiramate  (TOPAMAX ) 200 MG tablet Take 200 mg by mouth 2 (two) times daily.     [provider]  traZODone  (DESYREL ) 100 MG tablet Take 100 mg by mouth at bedtime.     [provider]  SUMAtriptan  (IMITREX ) 25 MG tablet Please take 1 tablet at the onset of head.May repeat in 2 hours if headache persists or recurs. 12/10/18 12/05/19  Corine Dice, MD    Current Outpatient Medications  Medication Sig Dispense Refill   DULoxetine (CYMBALTA) 60 MG capsule Take 60 mg by mouth daily.     hydrOXYzine  (ATARAX /VISTARIL ) 10 MG tablet Take 10 mg by mouth 3 (three) times daily as needed for anxiety.      Multiple Vitamin (MULTIVITAMIN WITH MINERALS) TABS tablet Take 1 tablet by mouth daily.     prazosin  (MINIPRESS ) 2 MG capsule Take  2 mg by mouth at bedtime.     topiramate  (TOPAMAX ) 200 MG tablet Take 200 mg by mouth 2 (two) times daily.      traZODone  (DESYREL ) 100 MG tablet Take 100 mg by mouth at bedtime.      ASHWAGANDHA PO Take 1 tablet by mouth daily.     diclofenac Sodium (VOLTAREN) 1 % GEL Apply 2 g topically 3 (three) times daily as needed (back pain).     fluticasone  (FLONASE ) 50 MCG/ACT nasal spray Place 1-2 sprays into both nostrils daily. 16 g 0   pantoprazole  (PROTONIX ) 40 MG tablet Take 1 tablet (40 mg total) by mouth daily. 90 tablet 0   Rimegepant Sulfate (NURTEC PO) Take 70 mg by mouth as needed.     Current Facility-Administered  Medications  Medication Dose Route Frequency Provider Last Rate Last Admin   0.9 %  sodium chloride  infusion  500 mL Intravenous Once Daina Drum, MD        Allergies as of 06/13/2023 - Review Complete 06/13/2023  Allergen Reaction Noted   Egg-derived products Hives and Swelling 10/18/2017   Morphine Hives 10/18/2017   Sulfa antibiotics Nausea And Vomiting 10/02/2019    Family History  Problem Relation Age of Onset   Diabetes Father    Heart attack Father    Meniere's disease Sister    Esophageal cancer Maternal Grandfather    Colon cancer Neg Hx    Rectal cancer Neg Hx    Stomach cancer Neg Hx     Social History   Socioeconomic History   Marital status: Divorced    Spouse name: Not on file   Number of children: 2   Years of education: Not on file   Highest education level: Not on file  Occupational History   Occupation: optomitrist tech  Tobacco Use   Smoking status: Former    Current packs/day: 0.00    Types: Cigarettes    Quit date: 01/25/2015    Years since quitting: 8.3   Smokeless tobacco: Never  Vaping Use   Vaping status: Never Used  Substance and Sexual Activity   Alcohol use: Yes    Comment: 0-1 drink per week   Drug use: No   Sexual activity: Not on file  Other Topics Concern   Not on file  Social History Narrative   Patient is right-handed. She is divorced and lives in a 2 story house with her son. She drinks 2 cups of coffee a day and a soda QOD. She is active at work.   Social Drivers of Corporate investment banker Strain: Low Risk  (05/29/2023)   Received from [facility]   Overall Financial Resource Strain (CARDIA)    Difficulty of Paying Living Expenses: Not hard at all  Food Insecurity: No Food Insecurity (05/29/2023)   Received from [facility]   Hunger Vital Sign    Worried About Running Out of Food in the Last Year: Never true    Ran Out of Food in the Last Year: Never true  Transportation Needs: No Transportation Needs (05/29/2023)    Received from [facility] - Transportation    Lack of Transportation (Medical): No    Lack of Transportation (Non-Medical): No  Physical Activity: Unknown (05/29/2023)   Received from [facility]   Exercise Vital Sign    Days of Exercise per Week: 0 days    Minutes of Exercise per Session: Not on file  Stress: No Stress Concern Present (05/29/2023)  Received from [facility] - Occupational Stress Questionnaire    Feeling of Stress : Not at all  Social Connections: Socially Integrated (05/29/2023)   Received from [facility]   Social Network    How would you rate your social network (family, work, friends)?: Good participation with social networks  Intimate Partner Violence: Not At Risk (05/29/2023)   Received from [facility]   HITS    Over the last 12 months how often did your partner physically hurt you?: Never    Over the last 12 months how often did your partner insult you or talk down to you?: Never    Over the last 12 months how often did your partner threaten you with physical harm?: Never    Over the last 12 months how often did your partner scream or curse at you?: Never    Physical Exam: Vital signs in last 24 hours: BP (!) 153/98   Pulse 69   Temp 97.9 F (36.6 C) (Temporal)   Ht 5' 7.5" (1.715 m)   Wt 255 lb (115.7 kg)   SpO2 99%   BMI 39.35 kg/m  GEN: NAD EYE: Sclerae anicteric ENT: MMM CV: Non-tachycardic Pulm: No increased work of breathing GI: Soft, NT/ND NEURO:  Alert & Oriented   Regino Caprio, MD [facility]  06/13/2023 2:33 PM

## 2023-06-14 ENCOUNTER — Telehealth: Payer: Self-pay | Admitting: Lactation Services

## 2023-06-14 NOTE — Telephone Encounter (Signed)
 No answer/voicemail not set up.

## 2023-06-28 ENCOUNTER — Telehealth: Payer: Self-pay

## 2023-06-28 NOTE — Telephone Encounter (Signed)
 Second attempt called patient to schedule appointment no answer unable to leave a message.

## 2023-07-21 NOTE — Telephone Encounter (Signed)
 Patient is returning a call back to [place]. Please advise.

## 2023-07-25 ENCOUNTER — Ambulatory Visit (INDEPENDENT_AMBULATORY_CARE_PROVIDER_SITE_OTHER): Admitting: Gastroenterology

## 2023-07-25 ENCOUNTER — Encounter: Payer: Self-pay | Admitting: Gastroenterology

## 2023-07-25 VITALS — BP 114/70 | HR 63 | Ht 67.0 in | Wt 262.4 lb

## 2023-07-25 DIAGNOSIS — R1013 Epigastric pain: Secondary | ICD-10-CM | POA: Diagnosis not present

## 2023-07-25 DIAGNOSIS — K219 Gastro-esophageal reflux disease without esophagitis: Secondary | ICD-10-CM | POA: Diagnosis not present

## 2023-07-25 DIAGNOSIS — Z9884 Bariatric surgery status: Secondary | ICD-10-CM

## 2023-07-25 DIAGNOSIS — R110 Nausea: Secondary | ICD-10-CM | POA: Diagnosis not present

## 2023-07-25 DIAGNOSIS — K449 Diaphragmatic hernia without obstruction or gangrene: Secondary | ICD-10-CM | POA: Diagnosis not present

## 2023-07-25 MED ORDER — ESOMEPRAZOLE MAGNESIUM 40 MG PO CPDR: 40.0000 mg | DELAYED_RELEASE_CAPSULE | Freq: Every day | ORAL | 2 refills | Status: DC

## 2023-07-25 NOTE — Progress Notes (Signed)
 Chief Complaint: GERD Primary GI Doctor: Dr. Federico  HPI:  Patient is a  53  year old female patient with past medical history of prediabetes, vitamin D deficiency, right hip osteoarthritis and migraine headaches, who was self referred to me  for a complaint of uncontrolled GERD .   Last seen in GI office ([facility]) by Dr. Federico for screening colonoscopy.  Interval History     Patient presents with main complaint of uncontrolled reflux. She has pyrosis despite what she eats or drinks. She has also had some hoarseness. She has had nocturnal symptoms as well.     Patient has history of GERD and was taking Pantoprazole  40 mg po daily without improvement. She states she was recently placed on sucralfate 1 gram four times for past three weeks with no improvement. No dysphagia. Records show she was also on omeprazole no improvement. She also has mild nausea, no vomiting. Appetite has decreased out of fear of symptoms. She will also have daily epigastric pain with or without eating. She states the pain can occur long after eating.  She uses OTC Aleve  and Motrin few times a month. No black tarry stools.   No alcohol use. Nonsmoker.   Never had EGD.  No known family history of throat issues or CA.   Surgical history: right hemicolectomy, bariatric surgery  Wt Readings from Last 3 Encounters:  07/25/23 262 lb 6 oz (119 kg)  06/13/23 255 lb (115.7 kg)  04/12/23 255 lb (115.7 kg)    Past Medical History:  Diagnosis Date   Anxiety    Arthritis    Depression    GERD (gastroesophageal reflux disease)    Headache    Neuromuscular disorder (HCC)    Pre-diabetes     Past Surgical History:  Procedure Laterality Date   COLON RESECTION  2002   HEMICOLECTOMY Right 2002   LAPAROSCOPIC GASTRIC SLEEVE RESECTION N/A    SHOULDER ARTHROSCOPY Right    UPPER GI ENDOSCOPY N/A 12/16/2019   Procedure: UPPER GI ENDOSCOPY;  Surgeon: Tanda Locus, MD;  Location: [facility];  Service: General;  Laterality: N/A;     Current Outpatient Medications  Medication Sig Dispense Refill   ASHWAGANDHA PO Take 1 tablet by mouth daily.     clobetasol ointment (TEMOVATE) 0.05 % Apply 1 Application topically 2 (two) times daily.     cyclobenzaprine  (FLEXERIL ) 10 MG tablet Take 10 mg by mouth 2 (two) times daily as needed.     diclofenac Sodium (VOLTAREN) 1 % GEL Apply 2 g topically 3 (three) times daily as needed (back pain).     DULoxetine (CYMBALTA) 60 MG capsule Take 60 mg by mouth daily.     esomeprazole (NEXIUM) 40 MG capsule Take 1 capsule (40 mg total) by mouth daily at 12 noon. 30 capsule 2   levonorgestrel (MIRENA) 20 MCG/DAY IUD 1 each by Intrauterine route once.     lidocaine  (LIDODERM ) 5 % Place 1 patch onto the skin daily.     Multiple Vitamin (MULTIVITAMIN WITH MINERALS) TABS tablet Take 1 tablet by mouth daily.     nystatin (MYCOSTATIN/NYSTOP) powder Apply 1 Application topically 3 (three) times daily.     nystatin cream (MYCOSTATIN) Apply 1 Application topically 2 (two) times daily.     prazosin  (MINIPRESS ) 2 MG capsule Take 2 mg by mouth at bedtime.     promethazine  (PHENERGAN ) 12.5 MG tablet Take 12.5 mg by mouth every 6 (six) hours as needed for nausea or vomiting.  Rimegepant Sulfate (NURTEC PO) Take 70 mg by mouth as needed.     sucralfate (CARAFATE) 1 g tablet Take 1 g by mouth 4 (four) times daily.     topiramate  (TOPAMAX ) 200 MG tablet Take 200 mg by mouth 2 (two) times daily.  (Patient taking differently: Take 100 mg by mouth 2 (two) times daily.)     traMADol  (ULTRAM ) 50 MG tablet Take 50 mg by mouth daily.     traZODone  (DESYREL ) 100 MG tablet Take 100 mg by mouth at bedtime.      fluticasone  (FLONASE ) 50 MCG/ACT nasal spray Place 1-2 sprays into both nostrils daily. (Patient not taking: Reported on 07/25/2023) 16 g 0   hydrOXYzine  (ATARAX /VISTARIL ) 10 MG tablet Take 10 mg by mouth 3 (three) times daily as needed for anxiety.  (Patient not taking: Reported on 07/25/2023)     No current  facility-administered medications for this visit.    Allergies as of 07/25/2023 - Review Complete 07/25/2023  Allergen Reaction Noted   Egg-derived products Hives and Swelling 10/18/2017   Morphine Hives 10/18/2017   Sulfa antibiotics Nausea And Vomiting 10/02/2019    Family History  Problem Relation Age of Onset   Diabetes Father    Heart attack Father    Meniere's disease Sister    Esophageal cancer Maternal Grandfather    Colon cancer Neg Hx    Rectal cancer Neg Hx    Stomach cancer Neg Hx     Review of Systems:    Constitutional: No weight loss, fever, chills, weakness or fatigue HEENT: Eyes: No change in vision               Ears, Nose, Throat:  No change in hearing or congestion Skin: No rash or itching Cardiovascular: No chest pain, chest pressure or palpitations   Respiratory: No SOB or cough Gastrointestinal: See HPI and otherwise negative Genitourinary: No dysuria or change in urinary frequency Neurological: No headache, dizziness or syncope Musculoskeletal: No new muscle or joint pain Hematologic: No bleeding or bruising Psychiatric: No history of depression or anxiety    Physical Exam:  Vital signs: BP 114/70 (BP Location: Right Wrist, Patient Position: Sitting, Cuff Size: Normal)   Pulse 63   Ht 5' 7 (1.702 m)   Wt 262 lb 6 oz (119 kg)   BMI 41.09 kg/m   Constitutional:   Pleasant  female appears to be in NAD, Well developed, Well nourished, alert and cooperative Throat: Oral cavity and pharynx without inflammation, swelling or lesion.  Respiratory: Respirations even and unlabored. Lungs clear to auscultation bilaterally.   No wheezes, crackles, or rhonchi.  Cardiovascular: Normal S1, S2. Regular rate and rhythm. No peripheral edema, cyanosis or pallor.  Gastrointestinal:  Soft, nondistended, nontender. No rebound or guarding. Normal bowel sounds. No appreciable masses or hepatomegaly. Rectal:  Not performed.  Msk:  Symmetrical without gross  deformities. Without edema, no deformity or joint abnormality.  Neurologic:  Alert and  oriented x4;  grossly normal neurologically.  Skin:   Dry and intact without significant lesions or rashes. Psychiatric: Oriented to person, place and time. Demonstrates good judgement and reason without abnormal affect or behaviors.  RELEVANT LABS AND IMAGING: CBC    Latest Ref Rng & Units 12/17/2019    5:13 AM 12/16/2019    5:36 PM 12/09/2019    2:33 PM  CBC  WBC 4.0 - 10.5 K/uL 4.9   6.3   Hemoglobin 12.0 - 15.0 g/dL 86.4  86.1  13.4  Hematocrit 36.0 - 46.0 % 40.7  42.0  40.8   Platelets 150 - 400 K/uL 299   312      CMP     Latest Ref Rng & Units 12/17/2019    5:13 AM 12/09/2019    2:33 PM  CMP  Glucose 70 - 99 mg/dL 872  894   BUN 6 - 20 mg/dL 9  14   Creatinine 9.55 - 1.00 mg/dL 9.37  9.28   Sodium 864 - 145 mmol/L 140  138   Potassium 3.5 - 5.1 mmol/L 4.2  4.1   Chloride 98 - 111 mmol/L 110  103   CO2 22 - 32 mmol/L 21  26   Calcium 8.9 - 10.3 mg/dL 9.0  9.4   Total Protein 6.5 - 8.1 g/dL 7.7  7.8   Total Bilirubin 0.3 - 1.2 mg/dL 0.6  0.6   Alkaline Phos 38 - 126 U/L 65  65   AST 15 - 41 U/L 34  21   ALT 0 - 44 U/L 32  16   05/29/2023 labs show: BUN 11, creatinine 0.77, chloride of 111, CO2 17, normal LFTs, HA1C 5.6, folate 15.5, vitamin D26.9, vitamin B 84.1, vitamin B12 604, sed rate 13 06/13/23 colonoscopy, recall 10 years - The examined portion of the ileum was normal. - Patent side- to- side ileo- colonic anastomosis, characterized by healthy appearing mucosa. - Non- bleeding internal hemorrhoids. - No specimens collected.  Imaging 04/2019 UPPER GI SERIES WITH KUB  IMPRESSION: 1. Small sliding-type hiatal hernia without demonstrable GE reflux. 2. Normal esophageal motility. 3. Normal appearance of the stomach and duodenum   Assessment: Encounter Diagnoses  Name Primary?   Gastroesophageal reflux disease, unspecified whether esophagitis present Yes   Abdominal pain,  epigastric    Nausea without vomiting    History of bariatric surgery    Sliding hiatal hernia      53 year old female patient that presents for worsening reflux despite being on PPI therapy.  Patient does have history of bariatric surgery with sliding hiatal hernia noted on upper GI series.  Patient reports she has never had EGD.  Given that she has trialed and failed PPI therapies as well as trial of sucralfate we will go ahead and proceed with upper GI endoscopy to evaluate for esophagitis, gastritis, and evaluate anastomosis site.  Her symptoms are not typical for bile Larry colic with gallbladder therefore we will hold off on any imaging for now.  Patient denies any overt bleeding or black tarry stools.  Patient does admit to using NSAIDs which I educated her today she should not be using with history of bariatric surgery.  Will switch patient to esomeprazole until procedure scheduled to see if this provides her with more relief.     Patient up-to-date on colon screening, colonoscopy in [place] 2025 was normal.  Recommendations to follow-up in 10 years.  Plan: -Start esomeprazole 40 mg po daily take 1 capsule 45 minutes before breakfast -Take OTC Pepcid 20 mg at bedtime -Strict GERD diet, no late meals 3-4 before lying down -No NSAID's (motrin, ibuprofen) -Discontinue sucralfate , no improvement in symptoms  Thank you for the courtesy of this consult. Please call me with any questions or concerns.   Tyjanae Bartek, FNP-C [facility] 07/25/2023, 4:18 PM  Cc: [facility]

## 2023-07-25 NOTE — Patient Instructions (Addendum)
 Start esomeprazole 40 mg po daily take 1 capsule 45 minutes before breakfast Take OTC Pepcid 20 mg at bedtime Strict GERD diet, no late meals 3-4 before lying down No NSAID's (motrin, ibuprofen) Discontinue sucralfate   You have been scheduled for an endoscopy. Please follow written instructions given to you at your visit today.  If you use inhalers (even only as needed), please bring them with you on the day of your procedure.  If you take any of the following medications, they will need to be adjusted prior to your procedure:   DO NOT TAKE 7 DAYS PRIOR TO TEST- Trulicity (dulaglutide) Ozempic, Wegovy (semaglutide) Mounjaro (tirzepatide) Bydureon Bcise (exanatide extended release)  DO NOT TAKE 1 DAY PRIOR TO YOUR TEST Rybelsus (semaglutide) Adlyxin (lixisenatide) Victoza (liraglutide) Byetta (exanatide) ___________________________________________________________________________   Due to recent changes in healthcare laws, you may see the results of your imaging and laboratory studies on MyChart before your provider has had a chance to review them.  We understand that in some cases there may be results that are confusing or concerning to you. Not all laboratory results come back in the same time frame and the provider may be waiting for multiple results in order to interpret others.  Please give us  48 hours in order for your provider to thoroughly review all the results before contacting the office for clarification of your results.   _______________________________________________________  If your blood pressure at your visit was 140/90 or greater, please contact your primary care physician to follow up on this.  _______________________________________________________  If you are age 51 or older, your body mass index should be between 23-30. Your Body mass index is 41.09 kg/m. If this is out of the aforementioned range listed, please consider follow up with your Primary Care  Provider.  If you are age 51 or younger, your body mass index should be between 19-25. Your Body mass index is 41.09 kg/m. If this is out of the aformentioned range listed, please consider follow up with your Primary Care Provider.   ________________________________________________________  The [facility] providers would like to encourage you to use MYCHART to communicate with providers for non-urgent requests or questions.  Due to long hold times on the telephone, sending your provider a message by [facility] may be a faster and more efficient way to get a response.  Please allow 48 business hours for a response.  Please remember that this is for non-urgent requests.  _______________________________________________________  Thank you for trusting me with your gastrointestinal care. Deanna May, RNP

## 2023-07-26 NOTE — Progress Notes (Signed)
 I agree with the assessment and plan as outlined by Ms. May.

## 2023-08-02 ENCOUNTER — Other Ambulatory Visit (HOSPITAL_COMMUNITY): Payer: Self-pay | Admitting: General Surgery

## 2023-08-02 DIAGNOSIS — Z9884 Bariatric surgery status: Secondary | ICD-10-CM

## 2023-08-02 DIAGNOSIS — R12 Heartburn: Secondary | ICD-10-CM

## 2023-08-02 DIAGNOSIS — K219 Gastro-esophageal reflux disease without esophagitis: Secondary | ICD-10-CM

## 2023-08-03 ENCOUNTER — Ambulatory Visit: Admitting: Internal Medicine

## 2023-08-03 ENCOUNTER — Encounter: Payer: Self-pay | Admitting: Internal Medicine

## 2023-08-03 VITALS — BP 121/64 | HR 63 | Temp 97.4°F | Resp 11 | Ht 67.0 in | Wt 262.0 lb

## 2023-08-03 DIAGNOSIS — K2289 Other specified disease of esophagus: Secondary | ICD-10-CM | POA: Diagnosis not present

## 2023-08-03 DIAGNOSIS — R110 Nausea: Secondary | ICD-10-CM

## 2023-08-03 DIAGNOSIS — K3189 Other diseases of stomach and duodenum: Secondary | ICD-10-CM

## 2023-08-03 DIAGNOSIS — K449 Diaphragmatic hernia without obstruction or gangrene: Secondary | ICD-10-CM

## 2023-08-03 DIAGNOSIS — K2100 Gastro-esophageal reflux disease with esophagitis, without bleeding: Secondary | ICD-10-CM | POA: Diagnosis not present

## 2023-08-03 DIAGNOSIS — Z903 Acquired absence of stomach [part of]: Secondary | ICD-10-CM | POA: Diagnosis not present

## 2023-08-03 DIAGNOSIS — K219 Gastro-esophageal reflux disease without esophagitis: Secondary | ICD-10-CM

## 2023-08-03 DIAGNOSIS — R1013 Epigastric pain: Secondary | ICD-10-CM

## 2023-08-03 DIAGNOSIS — K2950 Unspecified chronic gastritis without bleeding: Secondary | ICD-10-CM | POA: Diagnosis not present

## 2023-08-03 DIAGNOSIS — K209 Esophagitis, unspecified without bleeding: Secondary | ICD-10-CM

## 2023-08-03 MED ORDER — FAMOTIDINE 20 MG PO TABS: 20.0000 mg | ORAL_TABLET | Freq: Two times a day (BID) | ORAL | 3 refills | Status: DC

## 2023-08-03 MED ORDER — SODIUM CHLORIDE 0.9 % IV SOLN: 500.0000 mL | Freq: Once | INTRAVENOUS | Status: DC

## 2023-08-03 MED ORDER — ESOMEPRAZOLE MAGNESIUM 40 MG PO CPDR: 40.0000 mg | DELAYED_RELEASE_CAPSULE | Freq: Two times a day (BID) | ORAL | 3 refills | Status: AC

## 2023-08-03 NOTE — Progress Notes (Signed)
 GASTROENTEROLOGY PROCEDURE H&P NOTE   Primary Care Physician: Inc, [facility]    Reason for Procedure:   GERD, epigastric ab pain, nausea, history of gastric sleeve  Plan:    EGD  Patient is appropriate for endoscopic procedure(s) in the ambulatory ([facility]) setting.  The nature of the procedure, as well as the risks, benefits, and alternatives were carefully and thoroughly reviewed with the patient. Ample time for discussion and questions allowed. The patient understood, was satisfied, and agreed to proceed.     HPI: Meghan Orr is a 53 y.o. female who presents for EGD for GERD, epigastric ab pain, history of gastric sleeve, and nausea. She was last seen in clinic on 07/25/23. History is unchanged since that last clinic visit. She is using BC powders, Aleve , etc.  Past Medical History:  Diagnosis Date   Anxiety    Arthritis    Depression    GERD (gastroesophageal reflux disease)    Headache    Neuromuscular disorder (HCC)    Pre-diabetes     Past Surgical History:  Procedure Laterality Date   COLON RESECTION  2002   HEMICOLECTOMY Right 2002   LAPAROSCOPIC GASTRIC SLEEVE RESECTION N/A    SHOULDER ARTHROSCOPY Right    UPPER GI ENDOSCOPY N/A 12/16/2019   Procedure: UPPER GI ENDOSCOPY;  Surgeon: Tanda Locus, MD;  Location: [facility];  Service: General;  Laterality: N/A;    Prior to Admission medications   Medication Sig Start Date End Date Taking? Authorizing Provider  cholecalciferol (VITAMIN D3) 25 MCG (1000 UNIT) tablet Take 1,000 Units by mouth daily.   Yes [provider]  DULoxetine (CYMBALTA) 30 MG capsule Take 30 mg by mouth daily.   Yes [provider]  ferrous sulfate 324 MG TBEC Take 324 mg by mouth daily with breakfast.   Yes [provider]  lidocaine  (LIDODERM ) 5 % Place 1 patch onto the skin daily. 06/07/21  Yes [provider]  magnesium  30 MG tablet Take 30 mg by mouth daily.   Yes [provider]   Multiple Vitamin (MULTIVITAMIN WITH MINERALS) TABS tablet Take 1 tablet by mouth daily.   Yes [provider]  prazosin  (MINIPRESS ) 2 MG capsule Take 2 mg by mouth at bedtime.   Yes [provider]  Rimegepant Sulfate (NURTEC PO) Take 70 mg by mouth as needed.   Yes [provider]  topiramate  (TOPAMAX ) 200 MG tablet Take 200 mg by mouth 2 (two) times daily.  Patient taking differently: Take 100 mg by mouth 2 (two) times daily.   Yes [provider]  traZODone  (DESYREL ) 100 MG tablet Take 100 mg by mouth at bedtime.    Yes [provider]  ASHWAGANDHA PO Take 1 tablet by mouth daily.    [provider]  clobetasol ointment (TEMOVATE) 0.05 % Apply 1 Application topically 2 (two) times daily.    [provider]  cyclobenzaprine  (FLEXERIL ) 10 MG tablet Take 10 mg by mouth 2 (two) times daily as needed.    [provider]  diclofenac (VOLTAREN) 75 MG EC tablet Take 75 mg by mouth 2 (two) times daily. Patient not taking: Reported on 08/03/2023 04/27/23   [provider]  diclofenac Sodium (VOLTAREN) 1 % GEL Apply 2 g topically 3 (three) times daily as needed (back pain).    [provider]  esomeprazole  (NEXIUM ) 40 MG capsule Take 1 capsule (40 mg total) by mouth daily at 12 noon. Patient not taking: Reported on 08/03/2023  07/25/23   May, Deanna J, NP  fluticasone  (FLONASE ) 50 MCG/ACT nasal spray Place 1-2 sprays into both nostrils daily. Patient not taking: Reported on 07/25/2023 08/06/20   Wieters, Hallie C, PA-C  hydrOXYzine  (ATARAX /VISTARIL ) 10 MG tablet Take 10 mg by mouth 3 (three) times daily as needed for anxiety.  Patient not taking: Reported on 07/25/2023    [provider]  levonorgestrel (MIRENA) 20 MCG/DAY IUD 1 each by Intrauterine route once.    [provider]  nystatin (MYCOSTATIN/NYSTOP) powder Apply 1 Application topically 3 (three) times daily. 05/29/23   [provider]   nystatin cream (MYCOSTATIN) Apply 1 Application topically 2 (two) times daily. 06/26/23 06/25/24  [provider]  pantoprazole  (PROTONIX ) 40 MG tablet Take 40 mg by mouth 2 (two) times daily before a meal. Patient not taking: Reported on 08/03/2023 06/23/23 09/21/23  [provider]  promethazine  (PHENERGAN ) 12.5 MG tablet Take 12.5 mg by mouth every 6 (six) hours as needed for nausea or vomiting.    [provider]  tirzepatide (ZEPBOUND) 2.5 MG/0.5ML injection vial Inject 2.5 mg into the skin. Patient not taking: Reported on 08/03/2023 07/27/23 08/24/23  [provider]  traMADol  (ULTRAM ) 50 MG tablet Take 50 mg by mouth daily.    [provider]  SUMAtriptan  (IMITREX ) 25 MG tablet Please take 1 tablet at the onset of head.May repeat in 2 hours if headache persists or recurs. 12/10/18 12/05/19  Blaise Aleene KIDD, MD    Current Outpatient Medications  Medication Sig Dispense Refill   cholecalciferol (VITAMIN D3) 25 MCG (1000 UNIT) tablet Take 1,000 Units by mouth daily.     DULoxetine (CYMBALTA) 30 MG capsule Take 30 mg by mouth daily.     ferrous sulfate 324 MG TBEC Take 324 mg by mouth daily with breakfast.     lidocaine  (LIDODERM ) 5 % Place 1 patch onto the skin daily.     magnesium  30 MG tablet Take 30 mg by mouth daily.     Multiple Vitamin (MULTIVITAMIN WITH MINERALS) TABS tablet Take 1 tablet by mouth daily.     prazosin  (MINIPRESS ) 2 MG capsule Take 2 mg by mouth at bedtime.     Rimegepant Sulfate (NURTEC PO) Take 70 mg by mouth as needed.     topiramate  (TOPAMAX ) 200 MG tablet Take 200 mg by mouth 2 (two) times daily.  (Patient taking differently: Take 100 mg by mouth 2 (two) times daily.)     traZODone  (DESYREL ) 100 MG tablet Take 100 mg by mouth at bedtime.      ASHWAGANDHA PO Take 1 tablet by mouth daily.     clobetasol ointment (TEMOVATE) 0.05 % Apply 1 Application topically 2 (two) times daily.     cyclobenzaprine  (FLEXERIL ) 10 MG tablet  Take 10 mg by mouth 2 (two) times daily as needed.     diclofenac (VOLTAREN) 75 MG EC tablet Take 75 mg by mouth 2 (two) times daily. (Patient not taking: Reported on 08/03/2023)     diclofenac Sodium (VOLTAREN) 1 % GEL Apply 2 g topically 3 (three) times daily as needed (back pain).     esomeprazole  (NEXIUM ) 40 MG capsule Take 1 capsule (40 mg total) by mouth daily at 12 noon. (Patient not taking: Reported on 08/03/2023) 30 capsule 2   fluticasone  (FLONASE ) 50 MCG/ACT nasal spray Place 1-2 sprays into both nostrils daily. (Patient not taking: Reported on 07/25/2023) 16 g 0   hydrOXYzine  (ATARAX /VISTARIL ) 10 MG tablet Take 10 mg by mouth 3 (  three) times daily as needed for anxiety.  (Patient not taking: Reported on 07/25/2023)     levonorgestrel (MIRENA) 20 MCG/DAY IUD 1 each by Intrauterine route once.     nystatin (MYCOSTATIN/NYSTOP) powder Apply 1 Application topically 3 (three) times daily.     nystatin cream (MYCOSTATIN) Apply 1 Application topically 2 (two) times daily.     pantoprazole  (PROTONIX ) 40 MG tablet Take 40 mg by mouth 2 (two) times daily before a meal. (Patient not taking: Reported on 08/03/2023)     promethazine  (PHENERGAN ) 12.5 MG tablet Take 12.5 mg by mouth every 6 (six) hours as needed for nausea or vomiting.     tirzepatide (ZEPBOUND) 2.5 MG/0.5ML injection vial Inject 2.5 mg into the skin. (Patient not taking: Reported on 08/03/2023)     traMADol  (ULTRAM ) 50 MG tablet Take 50 mg by mouth daily.     Current Facility-Administered Medications  Medication Dose Route Frequency Provider Last Rate Last Admin   0.9 %  sodium chloride  infusion  500 mL Intravenous Once Federico Rosario BROCKS, MD        Allergies as of 08/03/2023 - Review Complete 08/03/2023  Allergen Reaction Noted   Egg-derived products Hives and Swelling 10/18/2017   Sumatriptan  Anaphylaxis and Shortness Of Breath 08/29/2019   Morphine Hives 10/18/2017   Sulfa antibiotics Nausea And Vomiting 10/02/2019    Family History   Problem Relation Age of Onset   Diabetes Father    Heart attack Father    Meniere's disease Sister    Esophageal cancer Maternal Grandfather    Colon cancer Neg Hx    Rectal cancer Neg Hx    Stomach cancer Neg Hx     Social History   Socioeconomic History   Marital status: Divorced    Spouse name: Not on file   Number of children: 2   Years of education: Not on file   Highest education level: Not on file  Occupational History   Occupation: optomitrist tech  Tobacco Use   Smoking status: Former    Current packs/day: 0.00    Types: Cigarettes    Quit date: 01/25/2015    Years since quitting: 8.5   Smokeless tobacco: Never  Vaping Use   Vaping status: Never Used  Substance and Sexual Activity   Alcohol use: Yes    Comment: 0-1 drink per week   Drug use: No   Sexual activity: Not on file  Other Topics Concern   Not on file  Social History Narrative   Patient is right-handed. She is divorced and lives in a 2 story house with her son. She drinks 2 cups of coffee a day and a soda QOD. She is active at work.   Social Drivers of Corporate investment banker Strain: Low Risk  (07/13/2023)   Received from [facility]   Overall Financial Resource Strain (CARDIA)    Difficulty of Paying Living Expenses: Not very hard  Food Insecurity: No Food Insecurity (07/13/2023)   Received from [facility]   Hunger Vital Sign    Within the past 12 months, you worried that your food would run out before you got the money to buy more.: Never true    Within the past 12 months, the food you bought just didn't last and you didn't have money to get more.: Never true  Transportation Needs: No Transportation Needs (07/13/2023)   Received from [facility] - Transportation    Lack of Transportation (Medical): No  Lack of Transportation (Non-Medical): No  Physical Activity: Insufficiently Active (07/13/2023)   Received from [facility]   Exercise Vital Sign    On average, how many  days per week do you engage in moderate to strenuous exercise (like a brisk walk)?: 1 day    On average, how many minutes do you engage in exercise at this level?: 10 min  Stress: Patient Declined (07/13/2023)   Received from [facility] - Occupational Stress Questionnaire    Feeling of Stress : Patient declined  Social Connections: Somewhat Isolated (07/13/2023)   Received from [facility]   Social Network    How would you rate your social network (family, work, friends)?: Restricted participation with some degree of social isolation  Intimate Partner Violence: Not At Risk (07/13/2023)   Received from [facility]   HITS    Over the last 12 months how often did your partner physically hurt you?: Never    Over the last 12 months how often did your partner insult you or talk down to you?: Never    Over the last 12 months how often did your partner threaten you with physical harm?: Never    Over the last 12 months how often did your partner scream or curse at you?: Never    Physical Exam: Vital signs in last 24 hours: BP 122/78   Pulse 65   Temp (!) 97.4 F (36.3 C) (Temporal)   Ht 5' 7 (1.702 m)   Wt 262 lb (118.8 kg)   SpO2 100%   BMI 41.04 kg/m  GEN: NAD EYE: Sclerae anicteric ENT: MMM CV: Non-tachycardic Pulm: No increased work of breathing GI: Soft, NT/ND NEURO:  Alert & Oriented   Estefana Kidney, MD [facility]  08/03/2023 8:02 AM

## 2023-08-03 NOTE — Progress Notes (Signed)
 A/o x 3, VSS, gd SR's, pleased with anesthesia, report to RN

## 2023-08-03 NOTE — Patient Instructions (Addendum)
 Resume previous diet Continue present medications Begin Nexium  40 mg twice daily  Increase pepcid  to 20 mg twice daily Office visit 10/06/23 at 830 AM 2nd floor, if not able to keep appt, please r/s asap Await pathology results  Handouts/information given for Esophagitis and hiatal hernia  YOU HAD AN ENDOSCOPIC PROCEDURE TODAY AT THE [facility]:   Refer to the procedure report that was given to you for any specific questions about what was found during the examination.  If the procedure report does not answer your questions, please call your gastroenterologist to clarify.  If you requested that your care partner not be given the details of your procedure findings, then the procedure report has been included in a sealed envelope for you to review at your convenience later.  YOU SHOULD EXPECT: Some feelings of bloating in the abdomen. Passage of more gas than usual.  Walking can help get rid of the air that was put into your GI tract during the procedure and reduce the bloating. If you had a lower endoscopy (such as a colonoscopy or flexible sigmoidoscopy) you may notice spotting of blood in your stool or on the toilet paper. If you underwent a bowel prep for your procedure, you may not have a normal bowel movement for a few days.  Please Note:  You might notice some irritation and congestion in your nose or some drainage.  This is from the oxygen used during your procedure.  There is no need for concern and it should clear up in a day or so.  SYMPTOMS TO REPORT IMMEDIATELY:  Following upper endoscopy (EGD)  Vomiting of blood or coffee ground material  New chest pain or pain under the shoulder blades  Painful or persistently difficult swallowing  New shortness of breath  Fever of 100F or higher  Black, tarry-looking stools For urgent or emergent issues, a gastroenterologist can be reached at any hour by calling [phone]. Do not use MyChart messaging for urgent concerns.    DIET:  We do recommend a small meal at first, but then you may proceed to your regular diet.  Drink plenty of fluids but you should avoid alcoholic beverages for 24 hours.  ACTIVITY:  You should plan to take it easy for the rest of today and you should NOT DRIVE or use heavy machinery until tomorrow (because of the sedation medicines used during the test).    FOLLOW UP: Our staff will call the number listed on your records the next business day following your procedure.  We will call around 7:15- 8:00 am to check on you and address any questions or concerns that you may have regarding the information given to you following your procedure. If we do not reach you, we will leave a message.     If any biopsies were taken you will be contacted by phone or by letter within the next 1-3 weeks.  Please call us  at [phone] if you have not heard about the biopsies in 3 weeks.   SIGNATURES/CONFIDENTIALITY: You and/or your care partner have signed paperwork which will be entered into your electronic medical record.  These signatures attest to the fact that that the information above on your After Visit Summary has been reviewed and is understood.  Full responsibility of the confidentiality of this discharge information lies with you and/or your care-partner.

## 2023-08-03 NOTE — Op Note (Signed)
 [facility] Patient Name: Meghan Orr Procedure Date: 08/03/2023 7:39 AM MRN: [id number] Endoscopist: Rosario Estefana Kidney , , [id number] Age: 53 Referring MD:  Date of Birth: 13-Dec-1970 Gender: Female Account #: [id number] Procedure:                Upper GI endoscopy Indications:              Epigastric abdominal pain, Heartburn, Nausea Medicines:                Monitored Anesthesia Care Procedure:                Pre-Anesthesia Assessment:                           - Prior to the procedure, a History and Physical                            was performed, and patient medications and                            allergies were reviewed. The patient's tolerance of                            previous anesthesia was also reviewed. The risks                            and benefits of the procedure and the sedation                            options and risks were discussed with the patient.                            All questions were answered, and informed consent                            was obtained. Prior Anticoagulants: The patient has                            taken no anticoagulant or antiplatelet agents. ASA                            Grade Assessment: III - A patient with severe                            systemic disease. After reviewing the risks and                            benefits, the patient was deemed in satisfactory                            condition to undergo the procedure.                           After obtaining informed consent, the endoscope was  passed under direct vision. Throughout the                            procedure, the patient's blood pressure, pulse, and                            oxygen saturations were monitored continuously. The                            GIF [id number] #[id number] was introduced through the                            mouth, and advanced to the second part of duodenum.                            The  upper GI endoscopy was accomplished without                            difficulty. The patient tolerated the procedure                            well. Scope In: Scope Out: Findings:                 LA Grade B (one or more mucosal breaks greater than                            5 mm, not extending between the tops of two mucosal                            folds) esophagitis with no bleeding was found in                            the distal esophagus. Biopsies were taken with a                            cold forceps for histology.                           Localized mucosal variance characterized by                            nodularity was found at the gastroesophageal                            junction. Biopsies were taken with a cold forceps                            for histology.                           A small hiatal hernia was present.                           Evidence of a sleeve gastrectomy was  found in the                            gastric body.                           Localized mildly erythematous mucosa without                            bleeding was found in the gastric antrum. Biopsies                            were taken with a cold forceps for histology.                           The examined duodenum was normal. Complications:            No immediate complications. Estimated Blood Loss:     Estimated blood loss was minimal. Impression:               - LA Grade B reflux esophagitis with no bleeding.                            Biopsied.                           - Esophageal mucosal variant. Biopsied.                           - Small hiatal hernia.                           - A sleeve gastrectomy was found.                           - Erythematous mucosa in the antrum. Biopsied.                           - Normal examined duodenum. Recommendation:           - Discharge patient to home (with escort).                           - Await pathology results.                            - Start Nexium  40 mg twice daily.                           - Increase Pepcid  to 20 mg BID.                           - Return to GI clinic in 2-3 months.                           - The findings and recommendations were discussed  with the patient. Dr Estefana Federico Rosario Estefana Federico,  08/03/2023 8:29:40 AM

## 2023-08-03 NOTE — Progress Notes (Signed)
 Called to room to assist during endoscopic procedure.  Patient ID and intended procedure confirmed with present staff. Received instructions for my participation in the procedure from the performing physician.

## 2023-08-03 NOTE — Progress Notes (Signed)
 Pt's states no medical or surgical changes since previsit or office visit.

## 2023-08-04 ENCOUNTER — Telehealth: Payer: Self-pay | Admitting: *Deleted

## 2023-08-04 NOTE — Telephone Encounter (Signed)
  Follow up Call-     08/03/2023    7:37 AM 06/13/2023    1:43 PM  Call back number  Post procedure Call Back phone  # [phone]  Permission to leave phone message Yes Yes     Post procedure follow up phone call. No answer at number given.  No VM. Unable to leave a message.

## 2023-08-09 ENCOUNTER — Ambulatory Visit: Payer: Self-pay | Admitting: Internal Medicine

## 2023-08-09 LAB — SURGICAL PATHOLOGY

## 2023-08-21 ENCOUNTER — Ambulatory Visit (HOSPITAL_COMMUNITY): Admission: RE | Admit: 2023-08-21 | Source: Ambulatory Visit

## 2023-08-21 ENCOUNTER — Encounter (HOSPITAL_COMMUNITY): Payer: Self-pay

## 2023-09-05 ENCOUNTER — Ambulatory Visit (HOSPITAL_COMMUNITY)
Admission: RE | Admit: 2023-09-05 | Discharge: 2023-09-05 | Disposition: A | Discharge status: Home / Self Care | Source: Ambulatory Visit | Attending: General Surgery | Admitting: General Surgery

## 2023-09-05 DIAGNOSIS — R12 Heartburn: Secondary | ICD-10-CM | POA: Diagnosis present

## 2023-09-05 DIAGNOSIS — K219 Gastro-esophageal reflux disease without esophagitis: Secondary | ICD-10-CM | POA: Insufficient documentation

## 2023-09-05 DIAGNOSIS — Z9884 Bariatric surgery status: Secondary | ICD-10-CM | POA: Insufficient documentation

## 2023-10-06 ENCOUNTER — Ambulatory Visit (INDEPENDENT_AMBULATORY_CARE_PROVIDER_SITE_OTHER): Admitting: Internal Medicine

## 2023-10-06 ENCOUNTER — Encounter: Payer: Self-pay | Admitting: Internal Medicine

## 2023-10-06 VITALS — BP 122/74 | HR 70 | Ht 67.0 in | Wt 259.4 lb

## 2023-10-06 DIAGNOSIS — Z8719 Personal history of other diseases of the digestive system: Secondary | ICD-10-CM

## 2023-10-06 DIAGNOSIS — R1013 Epigastric pain: Secondary | ICD-10-CM

## 2023-10-06 DIAGNOSIS — K219 Gastro-esophageal reflux disease without esophagitis: Secondary | ICD-10-CM

## 2023-10-06 DIAGNOSIS — Z9884 Bariatric surgery status: Secondary | ICD-10-CM

## 2023-10-06 MED ORDER — DEXLANSOPRAZOLE 60 MG PO CPDR: 60.0000 mg | DELAYED_RELEASE_CAPSULE | Freq: Every day | ORAL | 3 refills | Status: AC

## 2023-10-06 NOTE — Patient Instructions (Addendum)
 We have sent the following medications to your pharmacy for you to pick up at your convenience:  Dexilant   Make sure to take Pepcid  twice a day.  You have been given some samples of Reflux gourmet - take after every meal.  You have been scheduled for an abdominal ultrasound at [facility] (1st floor of hospital) on 10/08/2023 at 9:00am. Please arrive 15 minutes prior to your appointment for registration. Make certain not to have anything to eat or drink 6 hours prior to your appointment. Should you need to reschedule your appointment, please contact radiology at [phone]. This test typically takes about 30 minutes to perform.

## 2023-10-06 NOTE — Progress Notes (Unsigned)
 Chief Complaint: GERD Primary GI Doctor: Dr. Federico  HPI:  Patient is a  53  year old female patient with past medical history of prediabetes, vitamin D deficiency, right hip osteoarthritis and migraine headaches, who was self referred to me  for a complaint of uncontrolled GERD .   Last seen in GI office ([facility]) by Dr. Federico for screening colonoscopy.  Interval History     Patient presents with main complaint of uncontrolled reflux. She has pyrosis despite what she eats or drinks. She has also had some hoarseness. She has had nocturnal symptoms as well.     Patient has history of GERD and was taking Pantoprazole  40 mg po daily without improvement. She states she was recently placed on sucralfate 1 gram four times for past three weeks with no improvement. No dysphagia. Records show she was also on omeprazole no improvement. She also has mild nausea, no vomiting. Appetite has decreased out of fear of symptoms. She will also have daily epigastric pain with or without eating. She states the pain can occur long after eating.  She uses OTC Aleve  and Motrin few times a month. No black tarry stools.   No alcohol use. Nonsmoker.   Never had EGD.  No known family history of throat issues or CA.   Surgical history: right hemicolectomy, bariatric surgery  Interval History: She feels cramping in the epigastric area with burning sensation. She is taking Nexium  80 mg BID currently. She is taking this medication before meals. She is not using Pepcid . Pain will occur even before eating and worsens after eating.   Wt Readings from Last 3 Encounters:  10/06/23 259 lb 6 oz (117.7 kg)  08/03/23 262 lb (118.8 kg)  07/25/23 262 lb 6 oz (119 kg)    Past Medical History:  Diagnosis Date   Anxiety    Arthritis    Depression    GERD (gastroesophageal reflux disease)    Headache    Neuromuscular disorder (HCC)    Pre-diabetes     Past Surgical History:  Procedure Laterality Date   COLON RESECTION   2002   HEMICOLECTOMY Right 2002   LAPAROSCOPIC GASTRIC SLEEVE RESECTION N/A    SHOULDER ARTHROSCOPY Right    UPPER GI ENDOSCOPY N/A 12/16/2019   Procedure: UPPER GI ENDOSCOPY;  Surgeon: Tanda Locus, MD;  Location: [facility];  Service: General;  Laterality: N/A;    Current Outpatient Medications  Medication Sig Dispense Refill   ASHWAGANDHA PO Take 1 tablet by mouth daily.     cholecalciferol (VITAMIN D3) 25 MCG (1000 UNIT) tablet Take 1,000 Units by mouth daily.     clobetasol ointment (TEMOVATE) 0.05 % Apply 1 Application topically 2 (two) times daily.     cyclobenzaprine  (FLEXERIL ) 10 MG tablet Take 10 mg by mouth 2 (two) times daily as needed.     diclofenac (VOLTAREN) 75 MG EC tablet Take 75 mg by mouth 2 (two) times daily.     diclofenac Sodium (VOLTAREN) 1 % GEL Apply 2 g topically 3 (three) times daily as needed (back pain).     esomeprazole  (NEXIUM ) 40 MG capsule Take 1 capsule (40 mg total) by mouth 2 (two) times daily before a meal. 180 capsule 3   famotidine  (PEPCID ) 20 MG tablet Take 1 tablet (20 mg total) by mouth 2 (two) times daily. 180 tablet 3   ferrous sulfate 324 MG TBEC Take 324 mg by mouth daily with breakfast.     fluticasone  (FLONASE ) 50 MCG/ACT nasal spray  Place 1-2 sprays into both nostrils daily. 16 g 0   hydrOXYzine  (ATARAX /VISTARIL ) 10 MG tablet Take 10 mg by mouth 3 (three) times daily as needed for anxiety.      ibuprofen (ADVIL) 600 MG tablet Take 600 mg by mouth daily.     levonorgestrel (MIRENA) 20 MCG/DAY IUD 1 each by Intrauterine route once.     lidocaine  (LIDODERM ) 5 % Place 1 patch onto the skin daily.     magnesium  30 MG tablet Take 30 mg by mouth daily.     Multiple Vitamin (MULTIVITAMIN WITH MINERALS) TABS tablet Take 1 tablet by mouth daily.     nystatin (MYCOSTATIN/NYSTOP) powder Apply 1 Application topically 3 (three) times daily.     nystatin cream (MYCOSTATIN) Apply 1 Application topically 2 (two) times daily.     prazosin  (MINIPRESS ) 2 MG  capsule Take 2 mg by mouth at bedtime.     promethazine  (PHENERGAN ) 12.5 MG tablet Take 12.5 mg by mouth every 6 (six) hours as needed for nausea or vomiting.     Rimegepant Sulfate (NURTEC PO) Take 70 mg by mouth as needed.     topiramate  (TOPAMAX ) 200 MG tablet Take 200 mg by mouth 2 (two) times daily.  (Patient taking differently: Take 100 mg by mouth 2 (two) times daily.)     traMADol  (ULTRAM ) 50 MG tablet Take 50 mg by mouth daily.     traZODone  (DESYREL ) 100 MG tablet Take 100 mg by mouth at bedtime.      No current facility-administered medications for this visit.    Allergies as of 10/06/2023 - Review Complete 10/06/2023  Allergen Reaction Noted   Egg-derived products Hives and Swelling 10/18/2017   Sumatriptan  Anaphylaxis and Shortness Of Breath 08/29/2019   Morphine Hives 10/18/2017   Sulfa antibiotics Nausea And Vomiting 10/02/2019    Family History  Problem Relation Age of Onset   Diabetes Father    Heart attack Father    Meniere's disease Sister    Esophageal cancer Maternal Grandfather    Colon cancer Neg Hx    Rectal cancer Neg Hx    Stomach cancer Neg Hx      Physical Exam:  Vital signs: BP 122/74 (BP Location: Right Wrist, Patient Position: Sitting, Cuff Size: Normal)   Pulse 70   Ht 5' 7 (1.702 m)   Wt 259 lb 6 oz (117.7 kg)   BMI 40.62 kg/m   Constitutional:   Pleasant  female appears to be in NAD, Well developed, Well nourished, alert and cooperative Throat: Oral cavity and pharynx without inflammation, swelling or lesion.  Respiratory: Respirations even and unlabored. Lungs clear to auscultation bilaterally.   No wheezes, crackles, or rhonchi.  Cardiovascular: Normal S1, S2. Regular rate and rhythm. No peripheral edema, cyanosis or pallor.  Gastrointestinal:  Soft, nondistended, tender in epigastric area Rectal:  Not performed.  Msk:  Symmetrical without gross deformities. Without edema, no deformity or joint abnormality.  Neurologic:  Alert and   oriented x4;  grossly normal neurologically.  Skin:   Dry and intact without significant lesions or rashes. Psychiatric: Oriented to person, place and time. Demonstrates good judgement and reason without abnormal affect or behaviors.  RELEVANT LABS AND IMAGING: CBC    Latest Ref Rng & Units 12/17/2019    5:13 AM 12/16/2019    5:36 PM 12/09/2019    2:33 PM  CBC  WBC 4.0 - 10.5 K/uL 4.9   6.3   Hemoglobin 12.0 - 15.0 g/dL 86.4  13.8  13.4   Hematocrit 36.0 - 46.0 % 40.7  42.0  40.8   Platelets 150 - 400 K/uL 299   312      CMP     Latest Ref Rng & Units 12/17/2019    5:13 AM 12/09/2019    2:33 PM  CMP  Glucose 70 - 99 mg/dL 872  894   BUN 6 - 20 mg/dL 9  14   Creatinine 9.55 - 1.00 mg/dL 9.37  9.28   Sodium 864 - 145 mmol/L 140  138   Potassium 3.5 - 5.1 mmol/L 4.2  4.1   Chloride 98 - 111 mmol/L 110  103   CO2 22 - 32 mmol/L 21  26   Calcium 8.9 - 10.3 mg/dL 9.0  9.4   Total Protein 6.5 - 8.1 g/dL 7.7  7.8   Total Bilirubin 0.3 - 1.2 mg/dL 0.6  0.6   Alkaline Phos 38 - 126 U/L 65  65   AST 15 - 41 U/L 34  21   ALT 0 - 44 U/L 32  16   05/29/2023 labs show: BUN 11, creatinine 0.77, chloride of 111, CO2 17, normal LFTs, HA1C 5.6, folate 15.5, vitamin D26.9, vitamin B 84.1, vitamin B12 604, sed rate 13 06/13/23 colonoscopy, recall 10 years - The examined portion of the ileum was normal. - Patent side- to- side ileo- colonic anastomosis, characterized by healthy appearing mucosa. - Non- bleeding internal hemorrhoids. - No specimens collected.  EGD 08/03/23: - LA Grade B reflux esophagitis with no bleeding. Biopsied. - Esophageal mucosal variant. Biopsied. - Small hiatal hernia. - A sleeve gastrectomy was found. - Erythematous mucosa in the antrum. Biopsied. - Normal examined duodenum. Path: 1. Surgical [P], gastric :       GASTRIC ANTRAL AND OXYNTIC MUCOSA WITH HYPEREMIA AND SLIGHT CHRONIC INFLAMMATION.       NEGATIVE FOR HELICOBACTER PYLORI.       2. Surgical [P], GE junction :        GASTROESOPHAGEAL MUCOSA WITH INFLAMMATION CONSISTENT WITH REFLUX.       NEGATIVE FOR INTESTINAL METAPLASIA AND DYSPLASIA.   Imaging 04/2019 UPPER GI SERIES WITH KUB  IMPRESSION: 1. Small sliding-type hiatal hernia without demonstrable GE reflux. 2. Normal esophageal motility. 3. Normal appearance of the stomach and duodenum   Assessment: GERD History of esophagitis Epigastric ab pain     53 year old female patient that presents for worsening reflux despite being on PPI therapy.  Patient does have history of bariatric surgery with sliding hiatal hernia noted on upper GI series.  Patient reports she has never had EGD.  Given that she has trialed and failed PPI therapies as well as trial of sucralfate we will go ahead and proceed with upper GI endoscopy to evaluate for esophagitis, gastritis, and evaluate anastomosis site.  Her symptoms are not typical for bile Larry colic with gallbladder therefore we will hold off on any imaging for now.  Patient denies any overt bleeding or black tarry stools.  Patient does admit to using NSAIDs which I educated her today she should not be using with history of bariatric surgery.  Will switch patient to esomeprazole  until procedure scheduled to see if this provides her with more relief.     Patient up-to-date on colon screening, colonoscopy in May 2025 was normal.  Recommendations to follow-up in 10 years.  Plan: -Cont Nexium  40 mg BID. Will switch to Dexilant  60 mg QD -Start Pepcid  20 mg BID -Alginate therapy. Samples -RUQ U/S -Patient  will follow up with Dr. Tanda with [facility] -RTC 3 months  Estefana Kidney, MD  [facility] 10/06/2023, 8:54 AM  [facility]

## 2023-10-08 ENCOUNTER — Ambulatory Visit (HOSPITAL_COMMUNITY)
Admission: RE | Admit: 2023-10-08 | Discharge: 2023-10-08 | Disposition: A | Discharge status: Home / Self Care | Source: Ambulatory Visit | Attending: Internal Medicine | Admitting: Internal Medicine

## 2023-10-08 DIAGNOSIS — K219 Gastro-esophageal reflux disease without esophagitis: Secondary | ICD-10-CM | POA: Insufficient documentation

## 2023-10-08 DIAGNOSIS — Z9884 Bariatric surgery status: Secondary | ICD-10-CM | POA: Diagnosis present

## 2023-10-08 DIAGNOSIS — R1013 Epigastric pain: Secondary | ICD-10-CM | POA: Insufficient documentation

## 2023-10-08 DIAGNOSIS — Z8719 Personal history of other diseases of the digestive system: Secondary | ICD-10-CM | POA: Insufficient documentation

## 2023-10-10 ENCOUNTER — Ambulatory Visit: Payer: Self-pay | Admitting: Internal Medicine

## 2023-10-24 ENCOUNTER — Encounter: Attending: General Surgery | Admitting: Dietician

## 2023-10-24 ENCOUNTER — Encounter: Payer: Self-pay | Admitting: Dietician

## 2023-10-24 VITALS — Ht 67.5 in | Wt 264.2 lb

## 2023-10-24 DIAGNOSIS — E669 Obesity, unspecified: Secondary | ICD-10-CM | POA: Insufficient documentation

## 2023-10-24 NOTE — Progress Notes (Signed)
 Nutrition Assessment for Bariatric Surgery: Pre-Surgery Behavioral and Nutrition Intervention Program   Medical Nutrition Therapy  Appt Start Time: 1408    End Time: 1503  Patient was seen on 10/24/2023 for Pre-Operative Nutrition Assessment. Purpose of todays visit  enhance perioperative outcomes along with a healthy weight maintenance   Referral stated Supervised Weight Loss (SWL) visits needed: 0  Pt completed visits.   Pt has cleared nutrition requirements.   Planned surgery: conversion from sleeve to gastric by-pass Pt expectation of surgery: relief from GERD; goal weight 184 lbs  NUTRITION ASSESSMENT   Anthropometrics  Start weight at [facility]: 264.2 lbs (date: 10/24/2023)  Height: 67.5 in BMI: 40.77 kg/m2     Clinical  Medical hx: GERD, obesity, sleeve gastrectomy Medications:  see list Supplement: Vit B complex, Multivitamin Labs: Vit D 26.9, glucose 128, chloride 111, CO2 17 Notable signs/symptoms: none noted Any previous deficiencies? No  Evaluation of Nutritional Deficiencies: Micronutrient Nutrition Focused Physical Exam: Hair: No issues observed Eyes: No issues observed Mouth: No issues observed Neck: No issues observed Nails: No issues observed Skin: No issues observed  Lifestyle & Dietary Hx Pt states she takes the risk to eat eggs. 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  Current Physical Activity Recommendations state 150 minutes per week of moderate to vigorous movement including Cardio and 1-2 days of resistance activities as well as flexibility/balance activities:  Pts current physical activity: walking 3 times per week for 30 minutes, squats/wall push ups 3 times a day (3 days per week) with 75% recommendation reached   Sleep Hygiene: duration and quality: 5-6 hours per night  Current Patient Perceived Stress Level as stated by pt on a scale of 1-10:  4       Stress Management Techniques: acknowledge, accept and move accordantly   According to the  Dietary Guidelines for Americans Recommendation: equivalent 1.5-2 cups fruits per day, equivalent 2-3 cups vegetables per day and at least half all grains whole  Fruit servings per day (on average): 1-2, meeting 100% recommendation  Non-starchy vegetable servings per day (on average): 1, meeting 33-50% recommendation  Whole Grains per day (on average): 1  Number of meals missed/skipped per week out of 21: 7  24-Hr Dietary Recall First Meal: skip Snack:  protein shake, grapes, salami Second Meal: soup, chips, water Snack: granola bar, protein shake/drink Third Meal: chicken or beef or fish, vegetable, rice Snack:  Beverages: water, coffee  Alcoholic beverages per week: 0-2   Estimated Energy Needs Calories: 1500  NUTRITION DIAGNOSIS  Overweight/obesity ([place]-3.3) related to past poor dietary habits and physical inactivity as evidenced by patient w/ planned conversion from sleeve to gastric by-pass surgery following dietary guidelines for continued weight loss.  NUTRITION INTERVENTION  Nutrition counseling (C-1) and education (E-2) to facilitate bariatric surgery goals.  Educated pt on micronutrient deficiencies post-surgery and behavioral/dietary strategies to start in order to mitigate that risk   Behavioral and Dietary Interventions Pre-Op Goals Reviewed with the Patient Nutrition: Healthy Eating Behaviors Switch to non-caloric, non-carbonated and non-caffeinated beverages such as  water, unsweetened tea, Crystal Light and zero calorie beverages (aim for 64 oz. per day) Cut out grazing between meals or at night  Find a protein shake you like Eat every 3-5 hours        Eliminate distractions while eating (TV, computer, reading, driving, texting) Take 79-69 minutes to eat a meal  Decrease high sugar foods/decrease high fat/fried foods Eliminate alcoholic beverages Increase protein intake (eggs, fish, chicken, yogurt) before  surgery Eat non starchy vegetables 2 times a day 7 days  a week Eat complex carbohydrates such as whole grains and fruits   Behavioral Modification: Physical Activity Increase my usual daily activity (use stairs, park farther, etc.) Engage in _______________________  activity  _______ minutes ______ times per week  Other:    _________________________________________________________________     Problem Solving I will think about my usual eating patterns and how to tweak them How can my friends and family support me Barriers to starting my changes Learn and understand appetite verses hunger   Healthy Coping Allow for ___________ activities per week to help me manage stress Reframe negative thoughts I will keep a picture of someone or something that is my inspiration & look at it daily   Monitoring  Weigh myself once a week  Measure my progress by monitoring how my clothes fit Keep a food record of what I eat and drink for the next ________ (time period) Take pictures of what I eat and drink for the next ________ (time period) Use an app to count steps/day for the next_______ (time period) Measure my progress such as increased energy and more restful sleep Monitor your acid reflux and bowel habits, are they getting better?   *Goals that are bolded indicate the pt would like to start working towards these  Handouts Provided Include  Bariatric Surgery handouts (Nutrition Visits, Pre Surgery Behavioral Change Goals, Protein Shakes Brands to Choose From, Vitamins & Mineral Supplementation)  Learning Style & Readiness for Change Teaching method utilized: Visual, Auditory, and hands on  Demonstrated degree of understanding via: Teach Back  Readiness Level: preparation Barriers to learning/adherence to lifestyle change: nothing identified  RD's Notes for Next Visit    MONITORING & EVALUATION Dietary intake, weekly physical activity, body weight, and preoperative behavioral change goals   Next Steps  Pt has completed visits. No further  supervised visits required/recommended. Patient is to follow up at [facility] for pre-op class >2 weeks prior to scheduled surgery.

## 2023-10-31 ENCOUNTER — Ambulatory Visit (INDEPENDENT_AMBULATORY_CARE_PROVIDER_SITE_OTHER): Payer: Self-pay | Admitting: Licensed Clinical Social Worker

## 2023-10-31 DIAGNOSIS — F4320 Adjustment disorder, unspecified: Secondary | ICD-10-CM
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
## 2024-01-08 ENCOUNTER — Encounter

## 2024-01-22 ENCOUNTER — Encounter: Admitting: Skilled Nursing Facility1

## 2024-01-23 ENCOUNTER — Encounter: Payer: Self-pay | Admitting: Skilled Nursing Facility1

## 2024-01-23 NOTE — Progress Notes (Signed)
 Pre-Operative Nutrition Class:    Class start Time: 5:13   Class End Time: 6:15  This was a class of 4 patients.   Patient was seen on 01/22/2024 for Pre-Operative Bariatric Surgery [facility] at [facility]    Surgery date: 02/20/2024 Surgery type: RYGB Start weight at [facility]: 264.2 Weight today: 247.8  Samples given per MNT protocol. Patient educated on appropriate usage: Celebrate Vitamins Multivitamin Lot # [id number] Exp: 09/26   Celebrate Vitamins Calcium  Lot # [id number] Exp: 12/25   Ensure Max Protein Shake Lot # [id number] Exp: 1JUL 2026  The following the learning objectives were met by the patient during this course: Identify Pre-Op Dietary Goals and will begin 2 weeks pre-operatively Identify appropriate sources of fluids and proteins  State protein recommendations and appropriate sources pre and post-operatively Identify Post-Operative Dietary Goals and will follow for 2 weeks post-operatively Identify appropriate multivitamin, calcium, and thiamin sources Describe the need for physical activity post-operatively and will follow MD recommendations State when to call healthcare provider regarding medication questions or post-operative complications When having a diagnosis of diabetes understanding hypoglycemia symptoms and the inclusion of 1 complex carbohydrate per meal  Handouts given during class include: Pre-Op Bariatric Surgery Diet Handout Protein Shake Handout Post-Op Bariatric Surgery Nutrition Handout BELT Program Information Flyer [employer] Information Flyer [facility] Bariatric Supplements Price List  Follow-Up Plan: Patient will follow-up at [facility] 2 weeks post operatively for diet advancement per MD.

## 2024-01-24 ENCOUNTER — Other Ambulatory Visit (HOSPITAL_COMMUNITY): Payer: Self-pay

## 2024-01-24 ENCOUNTER — Telehealth: Payer: Self-pay

## 2024-01-24 NOTE — Telephone Encounter (Signed)
 Pharmacy Patient Advocate Encounter   Received notification from Patient Pharmacy that prior authorization for dexlansoprazole  (DEXILANT ) 60 MG capsule is required/requested.   Insurance verification completed.   The patient is insured through [employer] FEP.   Per test claim: PA required; PA submitted to above mentioned insurance via Fax Key/confirmation #/EOC [id number] FEP Status is pending

## 2024-01-26 ENCOUNTER — Other Ambulatory Visit (HOSPITAL_COMMUNITY): Payer: Self-pay

## 2024-01-26 NOTE — Telephone Encounter (Signed)
"  My chart message sent to pt  "

## 2024-01-26 NOTE — Telephone Encounter (Signed)
 Pharmacy Patient Advocate Encounter  Received notification from [facility] FEP that Prior Authorization for Dexlansoprazole  60MG  dr capsules has been APPROVED from 12-25-2023 to 01-23-2025. Ran test claim, Copay is $40.00 for 90 day supply or $15.00 for 30 day supply. This test claim was processed through [facility]- copay amounts may vary at other pharmacies due to pharmacy/plan contracts, or as the patient moves through the different stages of their insurance plan.   PA #/Case ID/Reference #: [id number]

## 2024-01-28 NOTE — Progress Notes (Signed)
 "  Office Visit Note  Patient: Meghan Orr             Date of Birth: 08/15/1970           MRN: [id number]             PCP: [facility] Referring: Mavis Redge SAILOR, FNP Visit Date: 01/30/2024 Occupation: Data Unavailable  Subjective:  New Patient (Initial Visit) (Tender to the touch, sporadic flares of pain in the back )   Discussed the use of AI scribe software for clinical note transcription with the patient, who gave verbal consent to proceed.  History of Present Illness   Meghan Orr is a 54 year old female who presents for evaluation of chronic widespread worsening pain. Today she is acutely in pain due to due to gallstones which were already under evaluation with planned scheduled cholecystectomy coming up.  She has been experiencing intermittent pain due to gallstones for several months, with a recent exacerbation over the last few days. The pain is persistent and significantly impacts her daily activities.  She has a long-standing history of chronic joint and muscle pain for over twenty years, characterized by tenderness to touch and severe flares requiring medical intervention such as injections in her back, feet, and shoulder. These interventions provide only temporary relief. Her pain management regimen includes duloxetine and previously Lyrica, though side effects have been problematic, especially at higher doses. She also uses Tylenol , a heating pad, and Flexeril  at night, which mainly aids in sleep rather than pain relief. No specific triggers for pain, visible swelling, or rashes. She sometimes experiences trouble sleeping due to pain.  She experiences migraines managed with Nurtec and Topamax  200 mg, which she has been taking for ten to fifteen years.  She has a history of irritable bowel syndrome, diagnosed approximately twenty-five to twenty-six years ago, contributing to gastrointestinal symptoms.  There is a family history of rheumatoid  arthritis among her great aunts, but her recent workup for rheumatoid arthritis in May of the previous year was unremarkable. No family history of fibromyalgia.  She reports experiencing brain fog and concentration issues, described as feeling 'loopy' and unable to grasp things, occurring around April or May of the previous year. She denies having menstrual periods due to an IUD.       Activities of Daily Living:  Patient reports morning stiffness for 30-60 minutes.   Patient Reports nocturnal pain.  Difficulty dressing/grooming: Denies Difficulty climbing stairs: Reports Difficulty getting out of chair: Denies Difficulty using hands for taps, buttons, cutlery, and/or writing: Denies  Review of Systems  Constitutional:  Positive for fatigue.  HENT:  Positive for mouth dryness. Negative for mouth sores.   Eyes:  Positive for dryness.  Respiratory:  Positive for shortness of breath.   Cardiovascular:  Positive for palpitations. Negative for chest pain.  Gastrointestinal:  Positive for constipation and diarrhea. Negative for blood in stool.  Endocrine: Negative for increased urination.  Genitourinary:  Negative for involuntary urination.  Musculoskeletal:  Positive for joint pain, gait problem, joint pain, joint swelling, myalgias, morning stiffness, muscle tenderness and myalgias. Negative for muscle weakness.  Skin:  Positive for color change. Negative for rash, hair loss and sensitivity to sunlight.  Allergic/Immunologic: Negative for susceptible to infections.  Neurological:  Positive for dizziness and headaches.  Hematological:  Negative for swollen glands.  Psychiatric/Behavioral:  Positive for depressed mood and sleep disturbance. The patient is nervous/anxious.     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    Past Medical History:  Diagnosis Date   Anxiety    Arthritis    Depression    GERD (gastroesophageal reflux disease)    Headache    Neuromuscular disorder (HCC)    Pre-diabetes    PTSD (post-traumatic stress disorder) 1996    Family History  Problem Relation Age of Onset   Healthy Mother    Diabetes Father    Heart attack Father    Stroke Father    Meniere's disease Sister    Healthy Sister    Healthy Sister    Healthy Brother    Healthy Brother    Healthy Brother    Esophageal cancer Maternal Grandfather    Healthy Son    Healthy Son    Colon cancer Neg Hx    Rectal cancer Neg Hx    Stomach cancer Neg Hx    Past Surgical History:  Procedure Laterality Date   COLON RESECTION  2002   HEMICOLECTOMY Right 2002   LAPAROSCOPIC GASTRIC SLEEVE RESECTION N/A    SHOULDER ARTHROSCOPY Right    UPPER GI ENDOSCOPY N/A 12/16/2019   Procedure: UPPER GI ENDOSCOPY;  Surgeon: Tanda Locus, MD;  Location: [facility];  Service: General;  Laterality: N/A;   Social History[1] Social History   Social History Narrative   Patient is right-handed. She is divorced and lives in a 2 story house with her son. She drinks 2 cups of coffee a day and a soda QOD. She is active at work.     There is no immunization history for the selected administration types on file for this patient.   Objective: Vital Signs: BP (!) 148/75 (BP Location: Left Arm, Patient Position: Sitting, Cuff Size: Normal)   Pulse 98   Temp (!) 97.3 F (36.3 C)   Resp 16   Ht 5' 7 (1.702 m)   Wt 240 lb (108.9 kg)   BMI 37.59 kg/m    Physical Exam Constitutional:      Appearance: She is obese.  Eyes:     Conjunctiva/sclera: Conjunctivae normal.  Cardiovascular:     Rate and Rhythm: Normal rate and regular rhythm.  Pulmonary:     Effort: Pulmonary effort is normal.     Breath sounds: Normal breath  sounds.  Abdominal:     Tenderness: There is abdominal tenderness. There is guarding.  Lymphadenopathy:     Cervical: No cervical adenopathy.  Skin:    General: Skin is warm and dry.     Findings: No rash.  Neurological:     Mental Status: She is alert.  Psychiatric:        Mood and Affect: Mood normal.      Musculoskeletal Exam:  Exam limited with severe upper abdominal pain currently. No focal joint tenderness or effusion with limited exam.   Investigation: No additional findings.  Imaging: No results found.  Recent Labs: Lab Results  Component Value Date   WBC 4.9 12/17/2019   HGB 13.5 12/17/2019   PLT 299 12/17/2019   NA 140 12/17/2019   K 4.2 12/17/2019   CL 110 12/17/2019   CO2 21 (L) 12/17/2019   GLUCOSE 127 (H) 12/17/2019   BUN 9 12/17/2019   CREATININE  0.62 12/17/2019   BILITOT 0.6 12/17/2019   ALKPHOS 65 12/17/2019   AST 34 12/17/2019   ALT 32 12/17/2019   PROT 7.7 12/17/2019   ALBUMIN 4.2 12/17/2019   CALCIUM 9.0 12/17/2019    Speciality Comments: No specialty comments available.  Procedures:  No procedures performed Allergies: Egg protein-containing drug products, Sumatriptan , Morphine, and Sulfa antibiotics   Assessment / Plan:     Visit Diagnoses:  Assessment & Plan Fibromyalgia syndrome  Chronic pain for over 20 years, exacerbated recently. Pain in back, feet, shoulder with tenderness. Previous workup negative for inflammatory conditions. Differential includes fibromyalgia and small fiber neuropathy. Symptoms: fatigue, sleep disruptions, migraines, IBS. Perimenopausal status may exacerbate symptoms. Current medications: duloxetine, Flexeril  with limited efficacy. - Consider postponing nerve ablation due to current pain. - Monitor symptoms, follow-up if symptoms persist or worsen.     Calculus of gallbladder without cholecystitis without obstruction Plan to present to ED for evaluation and management today for expedited management instead  of waiting for scheduled January 27th.       Follow-Up Instructions: Return in about 6 weeks (around 03/12/2024) for New pt FMS f/u ~6wks.   Lonni LELON Ester, MD  Note - This record has been created using [employer].  Chart creation errors have been sought, but may not always  have been located. Such creation errors do not reflect on  the standard of medical care.     [1]  Social History Tobacco Use   Smoking status: Former    Current packs/day: 0.00    Types: Cigarettes    Quit date: 01/25/2015    Years since quitting: 9.0    Passive exposure: Past   Smokeless tobacco: Never  Vaping Use   Vaping status: Never Used  Substance Use Topics   Alcohol use: Yes    Comment: 0-1 drink per week   Drug use: No   "

## 2024-01-30 ENCOUNTER — Encounter (HOSPITAL_BASED_OUTPATIENT_CLINIC_OR_DEPARTMENT_OTHER): Payer: Self-pay

## 2024-01-30 ENCOUNTER — Observation Stay (HOSPITAL_BASED_OUTPATIENT_CLINIC_OR_DEPARTMENT_OTHER)
Admission: EM | Admit: 2024-01-30 | Discharge: 2024-01-31 | Disposition: A | Discharge status: Home / Self Care | Attending: Internal Medicine | Admitting: Internal Medicine

## 2024-01-30 ENCOUNTER — Other Ambulatory Visit: Payer: Self-pay

## 2024-01-30 ENCOUNTER — Ambulatory Visit: Attending: Internal Medicine | Admitting: Internal Medicine

## 2024-01-30 ENCOUNTER — Emergency Department (HOSPITAL_BASED_OUTPATIENT_CLINIC_OR_DEPARTMENT_OTHER)

## 2024-01-30 ENCOUNTER — Encounter: Payer: Self-pay | Admitting: Internal Medicine

## 2024-01-30 VITALS — BP 148/75 | HR 98 | Temp 97.3°F | Resp 16 | Ht 67.0 in | Wt 240.0 lb

## 2024-01-30 DIAGNOSIS — K2970 Gastritis, unspecified, without bleeding: Secondary | ICD-10-CM | POA: Insufficient documentation

## 2024-01-30 DIAGNOSIS — R1011 Right upper quadrant pain: Secondary | ICD-10-CM | POA: Diagnosis not present

## 2024-01-30 DIAGNOSIS — K8013 Calculus of gallbladder with acute and chronic cholecystitis with obstruction: Principal | ICD-10-CM | POA: Insufficient documentation

## 2024-01-30 DIAGNOSIS — Z9889 Other specified postprocedural states: Secondary | ICD-10-CM | POA: Diagnosis not present

## 2024-01-30 DIAGNOSIS — K8020 Calculus of gallbladder without cholecystitis without obstruction: Secondary | ICD-10-CM | POA: Diagnosis present

## 2024-01-30 DIAGNOSIS — Z9884 Bariatric surgery status: Secondary | ICD-10-CM

## 2024-01-30 DIAGNOSIS — Z6837 Body mass index (BMI) 37.0-37.9, adult: Secondary | ICD-10-CM | POA: Diagnosis not present

## 2024-01-30 DIAGNOSIS — M797 Fibromyalgia: Secondary | ICD-10-CM

## 2024-01-30 DIAGNOSIS — G43909 Migraine, unspecified, not intractable, without status migrainosus: Secondary | ICD-10-CM | POA: Diagnosis not present

## 2024-01-30 DIAGNOSIS — E66812 Obesity, class 2: Secondary | ICD-10-CM | POA: Diagnosis not present

## 2024-01-30 DIAGNOSIS — Z87891 Personal history of nicotine dependence: Secondary | ICD-10-CM | POA: Insufficient documentation

## 2024-01-30 DIAGNOSIS — F4310 Post-traumatic stress disorder, unspecified: Secondary | ICD-10-CM | POA: Insufficient documentation

## 2024-01-30 DIAGNOSIS — R109 Unspecified abdominal pain: Secondary | ICD-10-CM | POA: Diagnosis present

## 2024-01-30 DIAGNOSIS — R1084 Generalized abdominal pain: Secondary | ICD-10-CM | POA: Diagnosis not present

## 2024-01-30 DIAGNOSIS — F418 Other specified anxiety disorders: Secondary | ICD-10-CM | POA: Diagnosis not present

## 2024-01-30 DIAGNOSIS — K219 Gastro-esophageal reflux disease without esophagitis: Secondary | ICD-10-CM | POA: Insufficient documentation

## 2024-01-30 DIAGNOSIS — Z743 Need for continuous supervision: Secondary | ICD-10-CM | POA: Diagnosis not present

## 2024-01-30 LAB — COMPREHENSIVE METABOLIC PANEL WITH GFR
ALT: 17 U/L (ref 0–44)
AST: 26 U/L (ref 15–41)
Albumin: 4.4 g/dL (ref 3.5–5.0)
Alkaline Phosphatase: 83 U/L (ref 38–126)
Anion gap: 11 (ref 5–15)
BUN: 11 mg/dL (ref 6–20)
CO2: 28 mmol/L (ref 22–32)
Calcium: 10.3 mg/dL (ref 8.9–10.3)
Chloride: 104 mmol/L (ref 98–111)
Creatinine, Ser: 0.6 mg/dL (ref 0.44–1.00)
GFR, Estimated: >60 mL/min
Glucose, Bld: 92 mg/dL (ref 70–99)
Potassium: 3.9 mmol/L (ref 3.5–5.1)
Sodium: 143 mmol/L (ref 135–145)
Total Bilirubin: 0.6 mg/dL (ref 0.0–1.2)
Total Protein: 7.8 g/dL (ref 6.5–8.1)

## 2024-01-30 LAB — CBC
HCT: 42.1 % (ref 36.0–46.0)
Hemoglobin: 14.2 g/dL (ref 12.0–15.0)
MCH: 26.8 pg (ref 26.0–34.0)
MCHC: 33.7 g/dL (ref 30.0–36.0)
MCV: 79.4 fL — ABNORMAL LOW (ref 80.0–100.0)
Platelets: 253 K/uL (ref 150–400)
RBC: 5.3 MIL/uL — ABNORMAL HIGH (ref 3.87–5.11)
RDW: 14.3 % (ref 11.5–15.5)
WBC: 5.1 K/uL (ref 4.0–10.5)
nRBC: 0 % (ref 0.0–0.2)

## 2024-01-30 LAB — URINALYSIS, ROUTINE W REFLEX MICROSCOPIC
Bilirubin Urine: NEGATIVE
Glucose, UA: NEGATIVE mg/dL
Hgb urine dipstick: NEGATIVE
Ketones, ur: NEGATIVE mg/dL
Leukocytes,Ua: NEGATIVE
Nitrite: NEGATIVE
Protein, ur: NEGATIVE mg/dL
Specific Gravity, Urine: 1.01 (ref 1.005–1.030)
pH: 6.5 (ref 5.0–8.0)

## 2024-01-30 LAB — LIPASE, BLOOD: Lipase: 40 U/L (ref 11–51)

## 2024-01-30 LAB — HIV ANTIBODY (ROUTINE TESTING W REFLEX): HIV Screen 4th Generation wRfx: NONREACTIVE

## 2024-01-30 MED ORDER — SENNOSIDES-DOCUSATE SODIUM 8.6-50 MG PO TABS: 1.0000 | ORAL_TABLET | Freq: Every evening | ORAL | Status: DC | PRN

## 2024-01-30 MED ORDER — FENTANYL CITRATE (PF) 50 MCG/ML IJ SOSY
50.0000 ug | PREFILLED_SYRINGE | Freq: Once | INTRAMUSCULAR | Status: AC
  Administered 2024-01-30: 50 ug via INTRAVENOUS
  Filled 2024-01-30: qty 1

## 2024-01-30 MED ORDER — PANTOPRAZOLE SODIUM 40 MG IV SOLR
40.0000 mg | Freq: Two times a day (BID) | INTRAVENOUS | Status: DC
  Administered 2024-01-30 – 2024-01-31 (×2): 40 mg via INTRAVENOUS
  Filled 2024-01-30 (×2): qty 10

## 2024-01-30 MED ORDER — ACETAMINOPHEN 325 MG PO TABS: 650.0000 mg | ORAL_TABLET | Freq: Four times a day (QID) | ORAL | Status: DC | PRN

## 2024-01-30 MED ORDER — ONDANSETRON HCL 4 MG PO TABS: 4.0000 mg | ORAL_TABLET | Freq: Four times a day (QID) | ORAL | Status: DC | PRN

## 2024-01-30 MED ORDER — FENTANYL CITRATE (PF) 50 MCG/ML IJ SOSY
12.5000 ug | PREFILLED_SYRINGE | INTRAMUSCULAR | Status: AC | PRN
  Administered 2024-01-30: 12.5 ug via INTRAVENOUS

## 2024-01-30 MED ORDER — BUTALBITAL-APAP-CAFFEINE 50-325-40 MG PO TABS
1.0000 | ORAL_TABLET | Freq: Four times a day (QID) | ORAL | Status: DC | PRN
  Administered 2024-01-31: 1 via ORAL
  Filled 2024-01-30: qty 1

## 2024-01-30 MED ORDER — FENTANYL CITRATE (PF) 50 MCG/ML IJ SOSY
25.0000 ug | PREFILLED_SYRINGE | INTRAMUSCULAR | Status: DC | PRN
  Filled 2024-01-30: qty 1

## 2024-01-30 MED ORDER — OXYCODONE HCL 5 MG PO TABS
5.0000 mg | ORAL_TABLET | ORAL | Status: DC | PRN
  Administered 2024-01-31: 5 mg via ORAL
  Filled 2024-01-30: qty 1

## 2024-01-30 MED ORDER — ONDANSETRON HCL 4 MG/2ML IJ SOLN
4.0000 mg | Freq: Once | INTRAMUSCULAR | Status: AC
  Administered 2024-01-30: 4 mg via INTRAVENOUS
  Filled 2024-01-30: qty 2

## 2024-01-30 MED ORDER — ACETAMINOPHEN 650 MG RE SUPP: 650.0000 mg | Freq: Four times a day (QID) | RECTAL | Status: DC | PRN

## 2024-01-30 MED ORDER — ONDANSETRON HCL 4 MG/2ML IJ SOLN
4.0000 mg | Freq: Four times a day (QID) | INTRAMUSCULAR | Status: DC | PRN
  Administered 2024-01-30 (×2): 4 mg via INTRAVENOUS
  Filled 2024-01-30 (×2): qty 2

## 2024-01-30 MED ORDER — METRONIDAZOLE 500 MG/100ML IV SOLN
500.0000 mg | Freq: Two times a day (BID) | INTRAVENOUS | Status: DC
  Administered 2024-01-30 – 2024-01-31 (×2): 500 mg via INTRAVENOUS
  Filled 2024-01-30 (×2): qty 100

## 2024-01-30 MED ORDER — SODIUM CHLORIDE 0.9 % IV BOLUS
1000.0000 mL | Freq: Once | INTRAVENOUS | Status: AC
  Administered 2024-01-30: 1000 mL via INTRAVENOUS

## 2024-01-30 MED ORDER — LACTATED RINGERS IV SOLN: INTRAVENOUS | Status: AC

## 2024-01-30 MED ORDER — SODIUM CHLORIDE 0.9 % IV SOLN
2.0000 g | INTRAVENOUS | Status: DC
  Administered 2024-01-30: 2 g via INTRAVENOUS
  Filled 2024-01-30: qty 20

## 2024-01-30 NOTE — H&P (View-Only) (Signed)
 Surgical Evaluation Requesting provider: Dr. Greig Free  Chief Complaint: abdominal pain  HPI: 54 year old woman with history of morbid obesity status post sleeve gastrectomy (November 2021, Dr. Tanda) as well as right hemicolectomy, gastritis, GERD, migraine, fibromyalgia, osteoarthritis, who presented to the emergency department today with unrelenting abdominal pain in the right upper quadrant.  She has been having intermittent episodes of pain since May of last year but yesterday around 3 PM developed the most severe episode that she has experienced.  Currently it is a little bit better, but she is still having some tenderness in the right upper quadrant.  Describes it as a burning sensation.  Associated with nausea.  Denies fever, chills, emesis, melena or hematochezia.  Workup in emergency department includes an unremarkable CMP and CBC; ultrasound demonstrates cholelithiasis without sonographic evidence of cholecystitis.  Common bile duct measured at 3.8 mm.  Of note she saw Dr. Tanda in October at which time she had been experiencing significant issues with weight management and GERD.  She was on Zepbound 7.5 at that time and was increased to 10 mg at that visit.  She is currently scheduled for cholecystectomy as well as conversion to gastric bypass at the end of this month with Dr. Tanda.  Allergies[1]  Past Medical History:  Diagnosis Date   Anxiety    Arthritis    Depression    GERD (gastroesophageal reflux disease)    Headache    Neuromuscular disorder (HCC)    Pre-diabetes    PTSD (post-traumatic stress disorder) 1996    Past Surgical History:  Procedure Laterality Date   COLON RESECTION  2002   HEMICOLECTOMY Right 2002   LAPAROSCOPIC GASTRIC SLEEVE RESECTION N/A    SHOULDER ARTHROSCOPY Right    UPPER GI ENDOSCOPY N/A 12/16/2019   Procedure: UPPER GI ENDOSCOPY;  Surgeon: Tanda Locus, MD;  Location: [facility];  Service: General;  Laterality: N/A;    Family History  Problem  Relation Age of Onset   Healthy Mother    Diabetes Father    Heart attack Father    Stroke Father    Meniere's disease Sister    Healthy Sister    Healthy Sister    Healthy Brother    Healthy Brother    Healthy Brother    Esophageal cancer Maternal Grandfather    Healthy Son    Healthy Son    Colon cancer Neg Hx    Rectal cancer Neg Hx    Stomach cancer Neg Hx     Social History   Socioeconomic History   Marital status: Divorced    Spouse name: Not on file   Number of children: 2   Years of education: Not on file   Highest education level: Not on file  Occupational History   Occupation: optomitrist tech  Tobacco Use   Smoking status: Former    Current packs/day: 0.00    Types: Cigarettes    Quit date: 01/25/2015    Years since quitting: 9.0    Passive exposure: Past   Smokeless tobacco: Never  Vaping Use   Vaping status: Never Used  Substance and Sexual Activity   Alcohol use: Yes    Comment: 0-1 drink per week   Drug use: No   Sexual activity: Not Currently    Partners: Male    Birth control/protection: I.U.D.  Other Topics Concern   Not on file  Social History Narrative   Patient is right-handed. She is divorced and lives in a 2 story house with  her son. She drinks 2 cups of coffee a day and a soda QOD. She is active at work.   Social Drivers of Health   Tobacco Use: Medium Risk (01/30/2024)   Patient History    Smoking Tobacco Use: Former    Smokeless Tobacco Use: Never    Passive Exposure: Past  Physicist, Medical Strain: Low Risk (08/07/2023)   Received from [facility]   Overall Financial Resource Strain (CARDIA)    How hard is it for you to pay for the very basics like food, housing, medical care, and heating?: Not hard at all  Food Insecurity: No Food Insecurity (01/30/2024)   Epic    Worried About Programme Researcher, Broadcasting/film/video in the Last Year: Never true    Ran Out of Food in the Last Year: Never true  Transportation Needs: No Transportation Needs  (01/30/2024)   Epic    Lack of Transportation (Medical): No    Lack of Transportation (Non-Medical): No  Physical Activity: Inactive (08/07/2023)   Received from [facility]   Exercise Vital Sign    On average, how many days per week do you engage in moderate to strenuous exercise (like a brisk walk)?: 0 days    Minutes of Exercise per Session: Not on file  Stress: No Stress Concern Present (08/07/2023)   Received from [facility] - Occupational Stress Questionnaire    Do you feel stress - tense, restless, nervous, or anxious, or unable to sleep at night because your mind is troubled all the time - these days?: Not at all  Social Connections: Socially Integrated (08/07/2023)   Received from [facility]   Social Network    How would you rate your social network (family, work, friends)?: Good participation with social networks  Recent Concern: Social Connections - Somewhat Isolated (07/13/2023)   Received from [facility]   Social Network    How would you rate your social network (family, work, friends)?: Restricted participation with some degree of social isolation  Depression (PHQ2-9): Not on file  Alcohol Screen: Not on file  Housing: Low Risk (01/30/2024)   Epic    Unable to Pay for Housing in the Last Year: No    Number of Times Moved in the Last Year: 0    Homeless in the Last Year: No  Utilities: Not At Risk (01/30/2024)   Epic    Threatened with loss of utilities: No  Health Literacy: Not on file    Medications Ordered Prior to Encounter[2]  Review of Systems: a complete, 10pt review of systems was completed with pertinent positives and negatives as documented in the HPI  Physical Exam: Vitals:   01/30/24 1622 01/30/24 1731  BP:  (!) 140/88  Pulse:  67  Resp:  16  Temp: 98.4 F (36.9 C) 98 F (36.7 C)  SpO2:  100%   Gen: A&Ox3, no distress  Chest: respiratory effort is normal.  Cardiovascular: RRR Gastrointestinal:  Soft, nondistended, focally tender with voluntary guarding in the right midclavicular line subcostal region.  No palpable mass or organomegaly. Neuro: No gross deficit Psych: appropriate mood and affect, normal insight/judgment intact  Skin: warm and dry      Latest Ref Rng & Units 01/30/2024   10:07 AM 12/17/2019    5:13 AM 12/16/2019    5:36 PM  CBC  WBC 4.0 - 10.5 K/uL 5.1  4.9    Hemoglobin 12.0 - 15.0 g/dL 85.7  86.4  13.8  Hematocrit 36.0 - 46.0 % 42.1  40.7  42.0   Platelets 150 - 400 K/uL 253  299         Latest Ref Rng & Units 01/30/2024   10:07 AM 12/17/2019    5:13 AM 12/09/2019    2:33 PM  CMP  Glucose 70 - 99 mg/dL 92  872  894   BUN 6 - 20 mg/dL 11  9  14    Creatinine 0.44 - 1.00 mg/dL 9.39  9.37  9.28   Sodium 135 - 145 mmol/L 143  140  138   Potassium 3.5 - 5.1 mmol/L 3.9  4.2  4.1   Chloride 98 - 111 mmol/L 104  110  103   CO2 22 - 32 mmol/L 28  21  26    Calcium 8.9 - 10.3 mg/dL 89.6  9.0  9.4   Total Protein 6.5 - 8.1 g/dL 7.8  7.7  7.8   Total Bilirubin 0.0 - 1.2 mg/dL 0.6  0.6  0.6   Alkaline Phos 38 - 126 U/L 83  65  65   AST 15 - 41 U/L 26  34  21   ALT 0 - 44 U/L 17  32  16     No results found for: INR, PROTIME  Imaging: US  Abdomen Limited Result Date: 01/30/2024 CLINICAL DATA:  Right upper quadrant pain with nausea. EXAM: ULTRASOUND ABDOMEN LIMITED RIGHT UPPER QUADRANT COMPARISON:  October 08, 2023 FINDINGS: Gallbladder: A 4.1 cm cluster of shadowing, echogenic gallstones is seen within the gallbladder lumen. There is no evidence of gallbladder wall thickening (1.5 mm). No sonographic Murphy sign noted by sonographer. Common bile duct: Diameter: 3.8 mm Liver: No focal lesion identified. Within normal limits in parenchymal echogenicity. Portal vein is patent on color Doppler imaging with normal direction of blood flow towards the liver. Other: None. IMPRESSION: Cholelithiasis, without evidence of acute cholecystitis. Electronically Signed   By:  Suzen Dials M.D.   On: 01/30/2024 13:04     A/P: Crescendoing biliary colic versus acute calculous cholecystitis, favor the latter given GLP-1 history. Will start empiric rocephin /flagyl  for suspected cholecystitis.  I recommend proceeding with laparoscopic cholecystectomy with possible cholangiogram.  Reviewed relevant anatomy, surgical technique, and risks of surgery including bleeding, infection, pain, scarring, intraabdominal injury specifically to the common bile duct and sequelae, bile leak, conversion to open surgery, subtotal cholecystectomy, blood clot, pneumonia, heart attack, stroke, failure to resolve symptoms, etc. Questions welcomed and answered.  Will plan laparoscopic cholecystectomy this admission, potentially tomorrow depending on OR availability.     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   Current Outpatient Medications on File Prior to Encounter  Medication Sig Dispense Refill  cholecalciferol (VITAMIN D3) 25 MCG (1000 UNIT) tablet Take 1,000 Units by mouth daily.     clobetasol ointment (TEMOVATE) 0.05 % Apply 1 Application topically 2 (two) times daily.      cyclobenzaprine  (FLEXERIL ) 10 MG tablet Take 10 mg by mouth 2 (two) times daily as needed.     dexlansoprazole  (DEXILANT ) 60 MG capsule Take 1 capsule (60 mg total) by mouth daily. 90 capsule 3   diclofenac (VOLTAREN) 75 MG EC tablet Take 75 mg by mouth 2 (two) times daily. (Patient taking differently: Take 75 mg by mouth as needed. Has to take sparingly due to gastric sleeve)     diclofenac Sodium (VOLTAREN) 1 % GEL Apply 2 g topically 3 (three) times daily as needed (back pain).     esomeprazole  (NEXIUM ) 40 MG capsule Take 1 capsule (40 mg total) by mouth 2 (two) times daily before a meal. 180 capsule 3   famotidine  (PEPCID ) 20 MG tablet Take 1 tablet (20 mg total) by mouth 2 (two) times daily. (Patient not taking: Reported on 01/30/2024) 180 tablet 3   fluticasone  (FLONASE ) 50 MCG/ACT nasal spray Place 1-2 sprays into both nostrils daily. (Patient not taking: Reported on 01/30/2024) 16 g 0   levonorgestrel (MIRENA) 20 MCG/DAY IUD 1 each by Intrauterine route once.     lidocaine  (LIDODERM ) 5 % Place 1 patch onto the skin daily. (Patient taking differently: Place 1 patch onto the skin as needed.)     magnesium  30 MG tablet Take 30 mg by mouth daily.     Multiple Vitamin (MULTIVITAMIN WITH MINERALS) TABS tablet Take 1 tablet by mouth daily.     nystatin (MYCOSTATIN/NYSTOP) powder Apply 1 Application topically 3 (three) times daily.     nystatin cream (MYCOSTATIN) Apply 1 Application topically 2 (two) times daily.     promethazine  (PHENERGAN ) 12.5 MG tablet Take 12.5 mg by mouth every 6 (six) hours as needed for nausea or vomiting.     Rimegepant Sulfate (NURTEC PO) Take 70 mg by mouth as needed.     topiramate  (TOPAMAX ) 200 MG tablet Take 200 mg by mouth 2 (two) times daily.  (Patient taking differently: Take 100 mg by mouth 2 (two) times daily.)     traZODone  (DESYREL ) 100 MG tablet Take 100 mg by mouth at bedtime.      [DISCONTINUED] SUMAtriptan  (IMITREX ) 25 MG tablet Please take 1 tablet at the onset  of head.May repeat in 2 hours if headache persists or recurs. 10 tablet 0

## 2024-01-30 NOTE — ED Notes (Signed)
 Called [facility] to transport the patient to [facility] rm# [id number]

## 2024-01-30 NOTE — ED Notes (Signed)
 PA at bedside.

## 2024-01-30 NOTE — H&P (Addendum)
 " History and Physical - Telemedicine  Reyne Falconi FMW:[id number] DOB: October 26, 1970 DOA: 01/30/2024  PCP: [facility]  Patient coming from: home  Referring provider: Merl Overland, EDP Telemedicine provider: Dr. Sherre Patient location: [place] Referring diagnosis: abdominal pain Patient name and DOB verified: Patient was able to verify her first and last name: Meghan Orr, date of birth: 09-29-1970. Patient consented to Telemedicine Evaluation: yes RN virtual assistant: Clotilda Pride, RN Video encounter time and date: 01/30/2024 and at approximately: 15:22  Chief Concern: abdominal pain  HPI: Ms. Meghan Orr is a 54 year old female with history of morbid obesity, history of gastric sleeve surgery, GERD.  01/30/2024: Patient presents to the ED for chief concerns of persistent abdominal pain specifically in the right upper quadrant.  Vitals in the ED showed t of 98.1, rr 17, hr 74, blood pressure 117/77, SpO2 99% on room air.  Serum sodium is 143, potassium 3.9, chloride 104, bicarb 98, BUN of 11, sCr 0.60, eGFR > 60, nonfasting blood glucose 92, WBC 5.1, hemoglobin 14.2, platelets of 253.  UA was negative for leukocytes and nitrates.    ED treatment: Fentanyl  50 mcg IV 2 doses, ondansetron  4 mg IV x 2, LR infusion, sodium chloride  1 L bolus.  EDP discussed case with general surgery who states that they will accept the patient for consideration of cholecystectomy.   01/30/2024: Patient admitted to hospitalist service via virtual admission and assessment. --------------------------- At bedside, via telemedicine encounter, patient was able to confirm her first and last name, her date of birth, location and current calendar year.  Patient reports that she has been having worsening right upper quadrant abdominal pain and epigastric pain for the last 6 to 7 months.  She endorses chronic and persistent nausea.  She denies fever, chills, vomiting, dysuria, hematuria, diarrhea, blood  in her stool, blood in your urine, swelling of her lower extremities.  She endorses persistent nausea.  She reports her last good bowel movement was yesterday and it was soft stools.  She denies trauma to her person.  Social history: She lives at home.  She denies tobacco and recreational drug use.  She drinks about 1 drink per week.  She works as an stage manager.  ROS: Constitutional: no weight change, no fever ENT/Mouth: no sore throat, no rhinorrhea Eyes: no eye pain, no vision changes Cardiovascular: no chest pain, no dyspnea,  no edema, no palpitations Respiratory: no cough, no sputum, no wheezing Gastrointestinal: + nausea, no vomiting, no diarrhea, no constipation Genitourinary: no urinary incontinence, no dysuria, no hematuria Musculoskeletal: no arthralgias, no myalgias Skin: no skin lesions, no pruritus, Neuro: no weakness, no loss of consciousness, no syncope Psych: no anxiety, no depression, + decrease appetite Heme/Lymph: no bruising, no bleeding  ED Course: Discussed with EDP, patient requiring hospitalization for chief concerns of right upper quadrant persistent abdominal pain that is not well-controlled.   Assessment/Plan  Principal Problem:   Abdominal pain Active Problems:   GERD (gastroesophageal reflux disease)   Migraine   Gastritis   Fibromyalgia syndrome   Morbid obesity (HCC)   S/P laparoscopic sleeve gastrectomy   Cholelithiasis   Assessment and Plan:  * Abdominal pain In setting of cholelithiasis without cholecystitis Symptomatic support: Fentanyl  12.5 mcg IV every 4 hours as needed for moderate pain, 1 day ordered; fentanyl  25 mcg IV every 4 hours as needed for severe pain, 1 day ordered Ondansetron  4 mg IV every 6 hours as needed for nausea, vomiting Status post sodium chloride  1  L bolus per EDP Continue with LR infusion at 100 mL/h Heart healthy diet N.p.o. after midnight per EDP via gen sx recommendation  Update: 19:20, gen sx has been  paged to Dr. Mitzie Freund who is aware and states patient pt will be added to list to be seen.  Gastritis I recommended that the first thing to avoid right now is anything with NSAIDs, aspirin products including BC powder, ibuprofen, Motrin Avoid processed food that includes nitrates And then the next thing patient can try avoiding would be caffeine  if the above does not help Consider anti-inflammatory diet  Migraine I recommended against patient taking BC powder, Aleve , Motrin and also any consideration of Goody powder, ibuprofen as this will cause gastritis and risk of gastric ulcer with perforation Patient endorses understanding and compliance Fioricet  every 6 hours as needed for migraine headaches ordered  GERD (gastroesophageal reflux disease) Protonix  40 mg IV twice daily, 3 doses ordered on admission  Morbid obesity (HCC) This complicates overall care and prognosis.  Patient is status post gastric sleeve surgery  Fibromyalgia syndrome PDMP reviewed, currently no active prescriptions  Chart reviewed.   DVT prophylaxis: Pharmacologic DVT not initiated on admission.  AM team to initiate pharmacologic DVT when the benefits outweigh the risk.  Code Status: full code  Diet: heart healthy; npo after midnight Family Communication: a phone call offered, pt declined Disposition Plan: Pending clinical course, pending general surgery evaluation Consults called: General Surgery has been paged after patient has arrived to [employer] [facility] Admission status: MedSurg, observation  Past Medical History:  Diagnosis Date   Anxiety    Arthritis    Depression    GERD (gastroesophageal reflux disease)    Headache    Neuromuscular disorder (HCC)    Pre-diabetes    PTSD (post-traumatic stress disorder) 1996   Past Surgical History:  Procedure Laterality Date   COLON RESECTION  2002   HEMICOLECTOMY Right 2002   LAPAROSCOPIC GASTRIC SLEEVE RESECTION N/A    SHOULDER ARTHROSCOPY Right     UPPER GI ENDOSCOPY N/A 12/16/2019   Procedure: UPPER GI ENDOSCOPY;  Surgeon: Tanda Locus, MD;  Location: [facility];  Service: General;  Laterality: N/A;   Social History:  reports that she quit smoking about 9 years ago. Her smoking use included cigarettes. She has been exposed to tobacco smoke. She has never used smokeless tobacco. She reports current alcohol use. She reports that she does not use drugs.  Allergies[1] Family History  Problem Relation Age of Onset   Healthy Mother    Diabetes Father    Heart attack Father    Stroke Father    Meniere's disease Sister    Healthy Sister    Healthy Sister    Healthy Brother    Healthy Brother    Healthy Brother    Esophageal cancer Maternal Grandfather    Healthy Son    Healthy Son    Colon cancer Neg Hx    Rectal cancer Neg Hx    Stomach cancer Neg Hx    Family history: Family history reviewed and not pertinent.  Prior to Admission medications  Medication Sig Start Date End Date Taking? Authorizing Provider  cholecalciferol (VITAMIN D3) 25 MCG (1000 UNIT) tablet Take 1,000 Units by mouth daily.    [provider]  clobetasol ointment (TEMOVATE) 0.05 % Apply 1 Application topically 2 (two) times daily.    [provider]  cyclobenzaprine  (FLEXERIL ) 10 MG tablet Take 10 mg by mouth 2 (two) times daily as  needed.    [provider]  dexlansoprazole  (DEXILANT ) 60 MG capsule Take 1 capsule (60 mg total) by mouth daily. 10/06/23   Federico Rosario BROCKS, MD  diclofenac (VOLTAREN) 75 MG EC tablet Take 75 mg by mouth 2 (two) times daily. Patient taking differently: Take 75 mg by mouth as needed. Has to take sparingly due to gastric sleeve 04/27/23   [provider]  diclofenac Sodium (VOLTAREN) 1 % GEL Apply 2 g topically 3 (three) times daily as needed (back pain).    [provider]  esomeprazole  (NEXIUM ) 40 MG capsule Take 1 capsule (40 mg total) by mouth 2 (two) times daily before a meal. 08/03/23   Federico Rosario BROCKS, MD  famotidine  (PEPCID ) 20 MG tablet Take 1 tablet (20 mg total) by mouth 2 (two) times daily. Patient not taking: Reported on 01/30/2024 08/03/23   Federico Rosario BROCKS, MD  fluticasone  (FLONASE ) 50 MCG/ACT nasal spray Place 1-2 sprays into both nostrils daily. Patient not taking: Reported on 01/30/2024 08/06/20   Wieters, Hallie C, PA-C  levonorgestrel (MIRENA) 20 MCG/DAY IUD 1 each by Intrauterine route once.    [provider]  lidocaine  (LIDODERM ) 5 % Place 1 patch onto the skin daily. Patient taking differently: Place 1 patch onto the skin as needed. 06/07/21   [provider]  magnesium  30 MG tablet Take 30 mg by mouth daily.    [provider]  Multiple Vitamin (MULTIVITAMIN WITH MINERALS) TABS tablet Take 1 tablet by mouth daily.    [provider]  nystatin (MYCOSTATIN/NYSTOP) powder Apply 1 Application topically 3 (three) times daily. 05/29/23   [provider]  nystatin cream (MYCOSTATIN) Apply 1 Application topically 2 (two) times daily. 06/26/23 06/25/24  [provider]  promethazine  (PHENERGAN ) 12.5 MG tablet Take 12.5 mg by mouth every 6 (six) hours as needed for nausea or vomiting.    [provider]  Rimegepant Sulfate (NURTEC PO) Take 70 mg by mouth as needed.    [provider]  topiramate  (TOPAMAX ) 200 MG tablet Take 200 mg by mouth 2 (two) times daily.  Patient taking differently: Take 100 mg by mouth 2 (two) times daily.    [provider]  traZODone  (DESYREL ) 100 MG tablet Take 100 mg by mouth at bedtime.     [provider]  SUMAtriptan  (IMITREX ) 25 MG tablet Please take 1 tablet at the onset of head.May repeat in 2 hours if headache persists or recurs. 12/10/18 12/05/19  Blaise Aleene KIDD, MD   Physical Exam completed with assistance of: Clotilda Pride, RN, who was at bedside during this portion of the virtual encounter:  Vitals:   01/30/24 1324 01/30/24 1622 01/30/24 1622 01/30/24 1731   BP: 117/77 126/75  (!) 140/88  Pulse: 74 69  67  Resp: 17 11  16   Temp: 98.1 F (36.7 C)  98.4 F (36.9 C) 98 F (36.7 C)  TempSrc: Oral  Oral   SpO2: 99% 100%  100%  Weight:      Height:       Constitutional: appears age-appropriate, NAD, calm Eyes: EOMI,  conjunctivae normal ENMT: Mucous membranes are moist. Hearing appropriate Neck: normal, supple, no masses, no thyromegaly Respiratory: clear to auscultation bilaterally, no wheezing. Normal respiratory effort. No accessory muscle use.  Cardiovascular: Regular rate and rhythm, no murmurs. No extremity edema. 2+ pedal pulses. Abdomen: + RUQ and epigastric tenderness. Bowel sounds positive.  Musculoskeletal: No joint deformity upper and lower extremities. Good ROM, no contractures,  no atrophy. Skin: no rashes, ulcers on visible skin Neurologic: Strength is appropriate upper extremities.  Psychiatric: Normal judgment and insight. Alert and oriented x 3. Normal mood.   EKG: not indicated at this time  Chest x-ray on Admission: not indicated at this time  US  Abdomen Limited Result Date: 01/30/2024 CLINICAL DATA:  Right upper quadrant pain with nausea. EXAM: ULTRASOUND ABDOMEN LIMITED RIGHT UPPER QUADRANT COMPARISON:  October 08, 2023 FINDINGS: Gallbladder: A 4.1 cm cluster of shadowing, echogenic gallstones is seen within the gallbladder lumen. There is no evidence of gallbladder wall thickening (1.5 mm). No sonographic Murphy sign noted by sonographer. Common bile duct: Diameter: 3.8 mm Liver: No focal lesion identified. Within normal limits in parenchymal echogenicity. Portal vein is patent on color Doppler imaging with normal direction of blood flow towards the liver. Other: None. IMPRESSION: Cholelithiasis, without evidence of acute cholecystitis. Electronically Signed   By: Suzen Dials M.D.   On: 01/30/2024 13:04   Labs on Admission: I have personally reviewed following labs.  CBC: Recent Labs  Lab 01/30/24 1007  WBC  5.1  HGB 14.2  HCT 42.1  MCV 79.4*  PLT 253   Basic Metabolic Panel: Recent Labs  Lab 01/30/24 1007  NA 143  K 3.9  CL 104  CO2 28  GLUCOSE 92  BUN 11  CREATININE 0.60  CALCIUM 10.3   GFR: Estimated Creatinine Clearance: 105.7 mL/min (by C-G formula based on SCr of 0.6 mg/dL).  Liver Function Tests: Recent Labs  Lab 01/30/24 1007  AST 26  ALT 17  ALKPHOS 83  BILITOT 0.6  PROT 7.8  ALBUMIN 4.4   Recent Labs  Lab 01/30/24 1007  LIPASE 40   Urine analysis:    Component Value Date/Time   COLORURINE STRAW (A) 01/30/2024 1020   APPEARANCEUR CLEAR 01/30/2024 1020   LABSPEC 1.010 01/30/2024 1020   PHURINE 6.5 01/30/2024 1020   GLUCOSEU NEGATIVE 01/30/2024 1020   HGBUR NEGATIVE 01/30/2024 1020   BILIRUBINUR NEGATIVE 01/30/2024 1020   KETONESUR NEGATIVE 01/30/2024 1020   PROTEINUR NEGATIVE 01/30/2024 1020   NITRITE NEGATIVE 01/30/2024 1020   LEUKOCYTESUR NEGATIVE 01/30/2024 1020   This document was prepared using Dragon Voice Recognition software and may include unintentional dictation errors.  Dr. Sherre [facility] Location: [place]  If 7PM-7AM, please contact overnight-coverage provider If 7AM-7PM, please contact day attending provider [email or web address]  01/30/2024, 7:27 PM      [1]  Allergies Allergen Reactions   Egg Protein-Containing Drug Products Hives and Swelling    Facial Swelling   Sumatriptan  Anaphylaxis and Shortness Of Breath   Morphine Hives   Sulfa Antibiotics Nausea And Vomiting   "

## 2024-01-30 NOTE — Patient Instructions (Signed)
 I recommend checking out the [facility] [facility] [place] patient-centered guide for fibromyalgia and chronic pain management: [email or web address]

## 2024-01-30 NOTE — ED Triage Notes (Addendum)
 Pt c/o RUQ abdominal pain and nausea xa while and worsening last night.  Pain score 7/10.   Hx of gallstones.  Pt has surgery scheduled for the 27th to have gallbladder removed.

## 2024-01-30 NOTE — Assessment & Plan Note (Addendum)
 Plan to present to ED for evaluation and management today for expedited management instead of waiting for scheduled January 27th.

## 2024-01-30 NOTE — Assessment & Plan Note (Addendum)
" °  Chronic pain for over 20 years, exacerbated recently. Pain in back, feet, shoulder with tenderness. Previous workup negative for inflammatory conditions. Differential includes fibromyalgia and small fiber neuropathy. Symptoms: fatigue, sleep disruptions, migraines, IBS. Perimenopausal status may exacerbate symptoms. Current medications: duloxetine, Flexeril  with limited efficacy. - Consider postponing nerve ablation due to current pain. - Monitor symptoms, follow-up if symptoms persist or worsen.     "

## 2024-01-30 NOTE — ED Notes (Addendum)
 Admitting MD on Caregility tablet.

## 2024-01-30 NOTE — ED Notes (Signed)
 PO trial tolerated. PA notified.

## 2024-01-30 NOTE — ED Notes (Signed)
 Report given To Rozelle GLENN RN

## 2024-01-30 NOTE — ED Notes (Signed)
"  Pt ambulatory to bathroom without assistance.   "

## 2024-01-30 NOTE — Consult Note (Addendum)
 Surgical Evaluation Requesting provider: Dr. Greig Free  Chief Complaint: abdominal pain  HPI: 54 year old woman with history of morbid obesity status post sleeve gastrectomy (November 2021, Dr. Tanda) as well as right hemicolectomy, gastritis, GERD, migraine, fibromyalgia, osteoarthritis, who presented to the emergency department today with unrelenting abdominal pain in the right upper quadrant.  She has been having intermittent episodes of pain since May of last year but yesterday around 3 PM developed the most severe episode that she has experienced.  Currently it is a little bit better, but she is still having some tenderness in the right upper quadrant.  Describes it as a burning sensation.  Associated with nausea.  Denies fever, chills, emesis, melena or hematochezia.  Workup in emergency department includes an unremarkable CMP and CBC; ultrasound demonstrates cholelithiasis without sonographic evidence of cholecystitis.  Common bile duct measured at 3.8 mm.  Of note she saw Dr. Tanda in October at which time she had been experiencing significant issues with weight management and GERD.  She was on Zepbound 7.5 at that time and was increased to 10 mg at that visit.  She is currently scheduled for cholecystectomy as well as conversion to gastric bypass at the end of this month with Dr. Tanda.  Allergies[1]  Past Medical History:  Diagnosis Date   Anxiety    Arthritis    Depression    GERD (gastroesophageal reflux disease)    Headache    Neuromuscular disorder (HCC)    Pre-diabetes    PTSD (post-traumatic stress disorder) 1996    Past Surgical History:  Procedure Laterality Date   COLON RESECTION  2002   HEMICOLECTOMY Right 2002   LAPAROSCOPIC GASTRIC SLEEVE RESECTION N/A    SHOULDER ARTHROSCOPY Right    UPPER GI ENDOSCOPY N/A 12/16/2019   Procedure: UPPER GI ENDOSCOPY;  Surgeon: Tanda Locus, MD;  Location: [facility];  Service: General;  Laterality: N/A;    Family History  Problem  Relation Age of Onset   Healthy Mother    Diabetes Father    Heart attack Father    Stroke Father    Meniere's disease Sister    Healthy Sister    Healthy Sister    Healthy Brother    Healthy Brother    Healthy Brother    Esophageal cancer Maternal Grandfather    Healthy Son    Healthy Son    Colon cancer Neg Hx    Rectal cancer Neg Hx    Stomach cancer Neg Hx     Social History   Socioeconomic History   Marital status: Divorced    Spouse name: Not on file   Number of children: 2   Years of education: Not on file   Highest education level: Not on file  Occupational History   Occupation: optomitrist tech  Tobacco Use   Smoking status: Former    Current packs/day: 0.00    Types: Cigarettes    Quit date: 01/25/2015    Years since quitting: 9.0    Passive exposure: Past   Smokeless tobacco: Never  Vaping Use   Vaping status: Never Used  Substance and Sexual Activity   Alcohol use: Yes    Comment: 0-1 drink per week   Drug use: No   Sexual activity: Not Currently    Partners: Male    Birth control/protection: I.U.D.  Other Topics Concern   Not on file  Social History Narrative   Patient is right-handed. She is divorced and lives in a 2 story house with  her son. She drinks 2 cups of coffee a day and a soda QOD. She is active at work.   Social Drivers of Health   Tobacco Use: Medium Risk (01/30/2024)   Patient History    Smoking Tobacco Use: Former    Smokeless Tobacco Use: Never    Passive Exposure: Past  Physicist, Medical Strain: Low Risk (08/07/2023)   Received from [facility]   Overall Financial Resource Strain (CARDIA)    How hard is it for you to pay for the very basics like food, housing, medical care, and heating?: Not hard at all  Food Insecurity: No Food Insecurity (01/30/2024)   Epic    Worried About Programme Researcher, Broadcasting/film/video in the Last Year: Never true    Ran Out of Food in the Last Year: Never true  Transportation Needs: No Transportation Needs  (01/30/2024)   Epic    Lack of Transportation (Medical): No    Lack of Transportation (Non-Medical): No  Physical Activity: Inactive (08/07/2023)   Received from [facility]   Exercise Vital Sign    On average, how many days per week do you engage in moderate to strenuous exercise (like a brisk walk)?: 0 days    Minutes of Exercise per Session: Not on file  Stress: No Stress Concern Present (08/07/2023)   Received from [facility] - Occupational Stress Questionnaire    Do you feel stress - tense, restless, nervous, or anxious, or unable to sleep at night because your mind is troubled all the time - these days?: Not at all  Social Connections: Socially Integrated (08/07/2023)   Received from [facility]   Social Network    How would you rate your social network (family, work, friends)?: Good participation with social networks  Recent Concern: Social Connections - Somewhat Isolated (07/13/2023)   Received from [facility]   Social Network    How would you rate your social network (family, work, friends)?: Restricted participation with some degree of social isolation  Depression (PHQ2-9): Not on file  Alcohol Screen: Not on file  Housing: Low Risk (01/30/2024)   Epic    Unable to Pay for Housing in the Last Year: No    Number of Times Moved in the Last Year: 0    Homeless in the Last Year: No  Utilities: Not At Risk (01/30/2024)   Epic    Threatened with loss of utilities: No  Health Literacy: Not on file    Medications Ordered Prior to Encounter[2]  Review of Systems: a complete, 10pt review of systems was completed with pertinent positives and negatives as documented in the HPI  Physical Exam: Vitals:   01/30/24 1622 01/30/24 1731  BP:  (!) 140/88  Pulse:  67  Resp:  16  Temp: 98.4 F (36.9 C) 98 F (36.7 C)  SpO2:  100%   Gen: A&Ox3, no distress  Chest: respiratory effort is normal.  Cardiovascular: RRR Gastrointestinal:  Soft, nondistended, focally tender with voluntary guarding in the right midclavicular line subcostal region.  No palpable mass or organomegaly. Neuro: No gross deficit Psych: appropriate mood and affect, normal insight/judgment intact  Skin: warm and dry      Latest Ref Rng & Units 01/30/2024   10:07 AM 12/17/2019    5:13 AM 12/16/2019    5:36 PM  CBC  WBC 4.0 - 10.5 K/uL 5.1  4.9    Hemoglobin 12.0 - 15.0 g/dL 85.7  86.4  13.8  Hematocrit 36.0 - 46.0 % 42.1  40.7  42.0   Platelets 150 - 400 K/uL 253  299         Latest Ref Rng & Units 01/30/2024   10:07 AM 12/17/2019    5:13 AM 12/09/2019    2:33 PM  CMP  Glucose 70 - 99 mg/dL 92  872  894   BUN 6 - 20 mg/dL 11  9  14    Creatinine 0.44 - 1.00 mg/dL 9.39  9.37  9.28   Sodium 135 - 145 mmol/L 143  140  138   Potassium 3.5 - 5.1 mmol/L 3.9  4.2  4.1   Chloride 98 - 111 mmol/L 104  110  103   CO2 22 - 32 mmol/L 28  21  26    Calcium 8.9 - 10.3 mg/dL 89.6  9.0  9.4   Total Protein 6.5 - 8.1 g/dL 7.8  7.7  7.8   Total Bilirubin 0.0 - 1.2 mg/dL 0.6  0.6  0.6   Alkaline Phos 38 - 126 U/L 83  65  65   AST 15 - 41 U/L 26  34  21   ALT 0 - 44 U/L 17  32  16     No results found for: INR, PROTIME  Imaging: US  Abdomen Limited Result Date: 01/30/2024 CLINICAL DATA:  Right upper quadrant pain with nausea. EXAM: ULTRASOUND ABDOMEN LIMITED RIGHT UPPER QUADRANT COMPARISON:  October 08, 2023 FINDINGS: Gallbladder: A 4.1 cm cluster of shadowing, echogenic gallstones is seen within the gallbladder lumen. There is no evidence of gallbladder wall thickening (1.5 mm). No sonographic Murphy sign noted by sonographer. Common bile duct: Diameter: 3.8 mm Liver: No focal lesion identified. Within normal limits in parenchymal echogenicity. Portal vein is patent on color Doppler imaging with normal direction of blood flow towards the liver. Other: None. IMPRESSION: Cholelithiasis, without evidence of acute cholecystitis. Electronically Signed   By:  Suzen Dials M.D.   On: 01/30/2024 13:04     A/P: Crescendoing biliary colic versus acute calculous cholecystitis, favor the latter given GLP-1 history. Will start empiric rocephin /flagyl  for suspected cholecystitis.  I recommend proceeding with laparoscopic cholecystectomy with possible cholangiogram.  Reviewed relevant anatomy, surgical technique, and risks of surgery including bleeding, infection, pain, scarring, intraabdominal injury specifically to the common bile duct and sequelae, bile leak, conversion to open surgery, subtotal cholecystectomy, blood clot, pneumonia, heart attack, stroke, failure to resolve symptoms, etc. Questions welcomed and answered.  Will plan laparoscopic cholecystectomy this admission, potentially tomorrow depending on OR availability.     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   Current Outpatient Medications on File Prior to Encounter  Medication Sig Dispense Refill  cholecalciferol (VITAMIN D3) 25 MCG (1000 UNIT) tablet Take 1,000 Units by mouth daily.     clobetasol ointment (TEMOVATE) 0.05 % Apply 1 Application topically 2 (two) times daily.      cyclobenzaprine  (FLEXERIL ) 10 MG tablet Take 10 mg by mouth 2 (two) times daily as needed.     dexlansoprazole  (DEXILANT ) 60 MG capsule Take 1 capsule (60 mg total) by mouth daily. 90 capsule 3   diclofenac (VOLTAREN) 75 MG EC tablet Take 75 mg by mouth 2 (two) times daily. (Patient taking differently: Take 75 mg by mouth as needed. Has to take sparingly due to gastric sleeve)     diclofenac Sodium (VOLTAREN) 1 % GEL Apply 2 g topically 3 (three) times daily as needed (back pain).     esomeprazole  (NEXIUM ) 40 MG capsule Take 1 capsule (40 mg total) by mouth 2 (two) times daily before a meal. 180 capsule 3   famotidine  (PEPCID ) 20 MG tablet Take 1 tablet (20 mg total) by mouth 2 (two) times daily. (Patient not taking: Reported on 01/30/2024) 180 tablet 3   fluticasone  (FLONASE ) 50 MCG/ACT nasal spray Place 1-2 sprays into both nostrils daily. (Patient not taking: Reported on 01/30/2024) 16 g 0   levonorgestrel (MIRENA) 20 MCG/DAY IUD 1 each by Intrauterine route once.     lidocaine  (LIDODERM ) 5 % Place 1 patch onto the skin daily. (Patient taking differently: Place 1 patch onto the skin as needed.)     magnesium  30 MG tablet Take 30 mg by mouth daily.     Multiple Vitamin (MULTIVITAMIN WITH MINERALS) TABS tablet Take 1 tablet by mouth daily.     nystatin (MYCOSTATIN/NYSTOP) powder Apply 1 Application topically 3 (three) times daily.     nystatin cream (MYCOSTATIN) Apply 1 Application topically 2 (two) times daily.     promethazine  (PHENERGAN ) 12.5 MG tablet Take 12.5 mg by mouth every 6 (six) hours as needed for nausea or vomiting.     Rimegepant Sulfate (NURTEC PO) Take 70 mg by mouth as needed.     topiramate  (TOPAMAX ) 200 MG tablet Take 200 mg by mouth 2 (two) times daily.  (Patient taking differently: Take 100 mg by mouth 2 (two) times daily.)     traZODone  (DESYREL ) 100 MG tablet Take 100 mg by mouth at bedtime.      [DISCONTINUED] SUMAtriptan  (IMITREX ) 25 MG tablet Please take 1 tablet at the onset  of head.May repeat in 2 hours if headache persists or recurs. 10 tablet 0

## 2024-01-30 NOTE — ED Provider Notes (Signed)
 " Meghan Orr EMERGENCY DEPARTMENT AT [facility] Provider Note   CSN: [id number] Arrival date & time: 01/30/24  9067     Patient presents with: Abdominal Pain and Nausea   Meghan Orr is a 54 y.o. female.   54 y.o female with a PMH of GERD, PTSD, Gastric sleeve, colon resection presents to the ED with a chief complaint of right upper quadrant pain x 6 months. Patient is scheduled to have a cholecystectomy by Dr. Tanda on February 20, 2024.  She reports her pain worsened last night around 4:00 in the morning, she did have a protein shake 10 which actually made her symptoms worse.  She reports she tried to go to the bathroom, but states that the pain was very severe along the right upper quadrant, she did take some Tylenol  without much improvement in her symptoms.  Her last oral intake prior to that was last night consisting of crab rice.  She did call Dr. Tanda who recommended she be evaluated in the emergency department.  She does report improvement in her symptoms however continues to have nausea.  No fever, vomiting, urinary symptoms.   The history is provided by the patient.  Abdominal Pain Pain location:  RUQ Pain quality: aching   Pain radiates to:  Does not radiate Pain severity:  Moderate Onset quality:  Sudden Duration:  6 hours Timing:  Constant Progression:  Waxing and waning Chronicity:  Recurrent Relieved by:  Nothing Worsened by:  Eating Ineffective treatments:  Acetaminophen  Associated symptoms: nausea   Associated symptoms: no chest pain, no chills, no diarrhea, no fever, no shortness of breath, no sore throat and no vomiting        Prior to Admission medications  Medication Sig Start Date End Date Taking? Authorizing Provider  ASHWAGANDHA PO Take 1 tablet by mouth daily. Patient not taking: Reported on 01/30/2024    [provider]  cholecalciferol (VITAMIN D3) 25 MCG (1000 UNIT) tablet Take 1,000 Units by mouth daily.    [provider]  clobetasol ointment (TEMOVATE) 0.05 % Apply 1 Application topically 2 (two) times daily.    [provider]  cyclobenzaprine  (FLEXERIL ) 10 MG tablet Take 10 mg by mouth 2 (two) times daily as needed.    [provider]  dexlansoprazole  (DEXILANT ) 60 MG capsule Take 1 capsule (60 mg total) by mouth daily. 10/06/23   Federico Rosario BROCKS, MD  diclofenac (VOLTAREN) 75 MG EC tablet Take 75 mg by mouth 2 (two) times daily. Patient taking differently: Take 75 mg by mouth as needed. Has to take sparingly due to gastric sleeve 04/27/23   [provider]  diclofenac Sodium (VOLTAREN) 1 % GEL Apply 2 g topically 3 (three) times daily as needed (back pain).    [provider]  esomeprazole  (NEXIUM ) 40 MG capsule Take 1 capsule (40 mg total) by mouth 2 (two) times daily before a meal. 08/03/23   Federico Rosario BROCKS, MD  famotidine  (PEPCID ) 20 MG tablet Take 1 tablet (20 mg total) by mouth 2 (two) times daily. Patient not taking: Reported on 01/30/2024 08/03/23   Federico Rosario BROCKS, MD  ferrous sulfate 324 MG TBEC Take 324 mg by mouth daily with breakfast. Patient not taking: Reported on 01/30/2024    [provider]  fluticasone  (FLONASE ) 50 MCG/ACT nasal spray Place 1-2 sprays into both nostrils daily. Patient not taking: Reported on 01/30/2024 08/06/20   Wieters, Hallie C, PA-C  hydrOXYzine  (ATARAX /VISTARIL ) 10 MG tablet Take 10 mg  by mouth 3 (three) times daily as needed for anxiety.  Patient not taking: Reported on 01/30/2024    [provider]  ibuprofen (ADVIL) 600 MG tablet Take 600 mg by mouth daily. Patient not taking: Reported on 01/30/2024 09/05/18   [provider]  levonorgestrel (MIRENA) 20 MCG/DAY IUD 1 each by Intrauterine route once.    [provider]  lidocaine  (LIDODERM ) 5 % Place 1 patch onto the skin daily. Patient taking differently: Place 1 patch onto the skin as needed. 06/07/21   [provider]  magnesium  30 MG tablet  Take 30 mg by mouth daily.    [provider]  Multiple Vitamin (MULTIVITAMIN WITH MINERALS) TABS tablet Take 1 tablet by mouth daily.    [provider]  nystatin (MYCOSTATIN/NYSTOP) powder Apply 1 Application topically 3 (three) times daily. 05/29/23   [provider]  nystatin cream (MYCOSTATIN) Apply 1 Application topically 2 (two) times daily. 06/26/23 06/25/24  [provider]  prazosin  (MINIPRESS ) 2 MG capsule Take 2 mg by mouth at bedtime. Patient not taking: Reported on 01/30/2024    [provider]  promethazine  (PHENERGAN ) 12.5 MG tablet Take 12.5 mg by mouth every 6 (six) hours as needed for nausea or vomiting.    [provider]  Rimegepant Sulfate (NURTEC PO) Take 70 mg by mouth as needed.    [provider]  topiramate  (TOPAMAX ) 200 MG tablet Take 200 mg by mouth 2 (two) times daily.  Patient taking differently: Take 100 mg by mouth 2 (two) times daily.    [provider]  traMADol  (ULTRAM ) 50 MG tablet Take 50 mg by mouth daily. Patient not taking: Reported on 01/30/2024    [provider]  traZODone  (DESYREL ) 100 MG tablet Take 100 mg by mouth at bedtime.     [provider]  SUMAtriptan  (IMITREX ) 25 MG tablet Please take 1 tablet at the onset of head.May repeat in 2 hours if headache persists or recurs. 12/10/18 12/05/19  Blaise Aleene KIDD, MD    Allergies: Egg protein-containing drug products, Sumatriptan , Morphine, and Sulfa antibiotics    Review of Systems  Constitutional:  Negative for chills and fever.  HENT:  Negative for sore throat.   Respiratory:  Negative for shortness of breath.   Cardiovascular:  Negative for chest pain.  Gastrointestinal:  Positive for abdominal pain and nausea. Negative for blood in stool, diarrhea and vomiting.  Genitourinary:  Negative for difficulty urinating and flank pain.  All other systems reviewed and are negative.   Updated Vital Signs BP 117/77 (BP  Location: Left Arm)   Pulse 74   Temp 98.1 F (36.7 C) (Oral)   Resp 17   Ht 5' 7 (1.702 m)   Wt 108.9 kg   SpO2 99%   BMI 37.59 kg/m   Physical Exam Vitals and nursing note reviewed.  Constitutional:      General: She is not in acute distress.    Appearance: She is well-developed.  HENT:     Head: Normocephalic and atraumatic.     Mouth/Throat:     Pharynx: No oropharyngeal exudate.  Eyes:     Pupils: Pupils are equal, round, and reactive to light.  Cardiovascular:     Rate and Rhythm: Regular rhythm.     Heart sounds: Normal heart sounds.  Pulmonary:     Effort: Pulmonary effort is normal. No respiratory distress.     Breath sounds: Normal breath sounds.  Abdominal:     General:  Bowel sounds are normal. There is no distension.     Palpations: Abdomen is soft.     Tenderness: There is abdominal tenderness in the right upper quadrant. There is guarding. There is no right CVA tenderness or left CVA tenderness.  Musculoskeletal:        General: No tenderness or deformity.     Cervical back: Normal range of motion.     Right lower leg: No edema.     Left lower leg: No edema.  Skin:    General: Skin is warm and dry.  Neurological:     Mental Status: She is alert and oriented to person, place, and time.     (all labs ordered are listed, but only abnormal results are displayed) Labs Reviewed  CBC - Abnormal; Notable for the following components:      Result Value   RBC 5.30 (*)    MCV 79.4 (*)    All other components within normal limits  URINALYSIS, ROUTINE W REFLEX MICROSCOPIC - Abnormal; Notable for the following components:   Color, Urine STRAW (*)    All other components within normal limits  LIPASE, BLOOD  COMPREHENSIVE METABOLIC PANEL WITH GFR    EKG: None  Radiology: US  Abdomen Limited Result Date: 01/30/2024 CLINICAL DATA:  Right upper quadrant pain with nausea. EXAM: ULTRASOUND ABDOMEN LIMITED RIGHT UPPER QUADRANT COMPARISON:  October 08, 2023  FINDINGS: Gallbladder: A 4.1 cm cluster of shadowing, echogenic gallstones is seen within the gallbladder lumen. There is no evidence of gallbladder wall thickening (1.5 mm). No sonographic Murphy sign noted by sonographer. Common bile duct: Diameter: 3.8 mm Liver: No focal lesion identified. Within normal limits in parenchymal echogenicity. Portal vein is patent on color Doppler imaging with normal direction of blood flow towards the liver. Other: None. IMPRESSION: Cholelithiasis, without evidence of acute cholecystitis. Electronically Signed   By: Suzen Dials M.D.   On: 01/30/2024 13:04     Procedures   Medications Ordered in the ED  fentaNYL  (SUBLIMAZE ) injection 50 mcg (50 mcg Intravenous Given 01/30/24 1146)  sodium chloride  0.9 % bolus 1,000 mL (0 mLs Intravenous Stopped 01/30/24 1349)  ondansetron  (ZOFRAN ) injection 4 mg (4 mg Intravenous Given 01/30/24 1146)  fentaNYL  (SUBLIMAZE ) injection 50 mcg (50 mcg Intravenous Given 01/30/24 1406)  ondansetron  (ZOFRAN ) injection 4 mg (4 mg Intravenous Given 01/30/24 1406)                                    Medical Decision Making Amount and/or Complexity of Data Reviewed Labs: ordered. Radiology: ordered.  Risk Prescription drug management.    This patient presents to the ED for concern of RUQ pain, this involves a number of treatment options, and is a complaint that carries with it a high risk of complications and morbidity.  The differential diagnosis includes cholecystitis, renal colic, versus lower lobe pneumonia.    Co morbidities: Discussed in HPI   Brief History:  See HPI.   EMR reviewed including pt PMHx, past surgical history and past visits to ER.   See HPI for more details   Lab Tests:  I ordered and independently interpreted labs.  The pertinent results include:    I personally reviewed all laboratory work and imaging. Metabolic panel without any acute abnormality specifically kidney function within normal limits  and no significant electrolyte abnormalities. CBC without leukocytosis or significant anemia.UA with no nitrites or leukocytes to suggest infection.  Imaging Studies:  RUQ ultrasound:  IMPRESSION:  Cholelithiasis, without evidence of acute cholecystitis.   Medicines ordered:  I ordered medication including fentanyl , bolus, zofran   for symptomatic treatment. Reevaluation of the patient after these medicines showed that the patient improved I have reviewed the patients home medicines and have made adjustments as needed  Consults:  I requested consultation with general surgery,  and discussed lab and imaging findings as well as pertinent plan - they recommend: P.o. trial along with pain control if symptoms do not improve call back for medicine admission.  Reevaluation:  After the interventions noted above I re-evaluated patient and found that they have :stayed the same  Social Determinants of Health:  The patient's social determinants of health were a factor in the care of this patient  Problem List / ED Course:  Patient present to the ED with a chief complaint of right upper quadrant pain has been ongoing for the past 6 months, she is scheduled for a cholecystectomy with Dr. Tanda on the 27th of this month.  She reports worsening pain around 4 AM this morning, had some nausea but no episodes of vomiting.  She did try to drink a protein shake which made her pain severely worse.  She reports there is exacerbation in symptoms with any oral intake.  Alleviated whenever she is not eating anything.  She did take some Tylenol  mild improvement in symptoms.  Given fentanyl  while in the emergency department here. Interpretation of her lab work reveals CMP with no electrolyte derangement, current levels within normal limits her LFTs are unremarkable, total bili is within normal limits.  Lipase levels normal.  CBC with no leukocytosis, hemoglobin is stable.  Symptomatic treatment such as Zofran ,  fentanyl , bolus was given.  Ultrasound of the right upper quadrant does show some cholelithiasis but no active cholecystitis.  Some concern for patient as she reports her pain continues to increase daily.  Will touch base with general surgery for further recommendations. General surgery recommended p.o. trial along with seeing of improvement in pain.  She reports improvement in pain however continues to be nauseated.  She is concerned that if she goes home symptoms will recur.  Given additional pain medication along with nausea medication.  Will call general surgery for likely needing cholecystectomy sooner than later. I discussed these findings with hospitalist at this time, patient is hemodynamically stable for admission.  Dispostion:  After consideration of the diagnostic results and the patients response to treatment, I feel that the patent would benefit from admission, case discussed with hospitalist.    Portions of this note were generated with Dragon dictation software. Dictation errors may occur despite best attempts at proofreading.   Final diagnoses:  Right upper quadrant abdominal pain    ED Discharge Orders     None          Maureen Broad, [place] 01/30/24 1533  "

## 2024-01-30 NOTE — Assessment & Plan Note (Signed)
 Protonix  40 mg IV twice daily, 3 doses ordered on admission

## 2024-01-30 NOTE — Assessment & Plan Note (Signed)
 I recommended that the first thing to avoid right now is anything with NSAIDs, aspirin products including BC powder, ibuprofen, Motrin Avoid processed food that includes nitrates And then the next thing patient can try avoiding would be caffeine  if the above does not help Consider anti-inflammatory diet

## 2024-01-30 NOTE — Assessment & Plan Note (Addendum)
 In setting of cholelithiasis without cholecystitis Symptomatic support: Fentanyl  12.5 mcg IV every 4 hours as needed for moderate pain, 1 day ordered; fentanyl  25 mcg IV every 4 hours as needed for severe pain, 1 day ordered Ondansetron  4 mg IV every 6 hours as needed for nausea, vomiting Status post sodium chloride  1 L bolus per EDP Continue with LR infusion at 100 mL/h Heart healthy diet N.p.o. after midnight per EDP via gen sx recommendation  Update: 19:20, gen sx has been paged to Dr. Mitzie Freund who is aware and states patient pt will be added to list to be seen.

## 2024-01-30 NOTE — ED Notes (Signed)
 ED Provider at bedside.

## 2024-01-30 NOTE — Hospital Course (Addendum)
 Ms. Meghan Orr is a 54 year old female with history of morbid obesity, history of gastric sleeve surgery, GERD.  01/30/2024: Patient presents to the ED for chief concerns of persistent abdominal pain specifically in the right upper quadrant.  Vitals in the ED showed t of 98.1, rr 17, hr 74, blood pressure 117/77, SpO2 99% on room air.  Serum sodium is 143, potassium 3.9, chloride 104, bicarb 98, BUN of 11, sCr 0.60, eGFR > 60, nonfasting blood glucose 92, WBC 5.1, hemoglobin 14.2, platelets of 253.  UA was negative for leukocytes and nitrates.    ED treatment: Fentanyl  50 mcg IV 2 doses, ondansetron  4 mg IV x 2, LR infusion, sodium chloride  1 L bolus.  EDP discussed case with general surgery who states that they will accept the patient for consideration of cholecystectomy.   01/30/2024: Patient admitted to hospitalist service via virtual admission and assessment.

## 2024-01-30 NOTE — ED Notes (Signed)
 Pt transported to US  via Wheelchair. No acute distress at this time.

## 2024-01-30 NOTE — Assessment & Plan Note (Addendum)
 This complicates overall care and prognosis.  Patient is status post gastric sleeve surgery

## 2024-01-30 NOTE — ED Notes (Signed)
Food provided per patient request.

## 2024-01-30 NOTE — Assessment & Plan Note (Signed)
 I recommended against patient taking BC powder, Aleve , Motrin and also any consideration of Goody powder, ibuprofen as this will cause gastritis and risk of gastric ulcer with perforation Patient endorses understanding and compliance Fioricet  every 6 hours as needed for migraine headaches ordered

## 2024-01-30 NOTE — Assessment & Plan Note (Signed)
 PDMP reviewed, currently no active prescriptions

## 2024-01-31 ENCOUNTER — Observation Stay (HOSPITAL_COMMUNITY)

## 2024-01-31 ENCOUNTER — Encounter (HOSPITAL_COMMUNITY): Payer: Self-pay | Admitting: Internal Medicine

## 2024-01-31 ENCOUNTER — Other Ambulatory Visit (HOSPITAL_COMMUNITY): Payer: Self-pay

## 2024-01-31 ENCOUNTER — Observation Stay (HOSPITAL_COMMUNITY): Admitting: Certified Registered Nurse Anesthetist

## 2024-01-31 ENCOUNTER — Encounter (HOSPITAL_COMMUNITY)
Admission: EM | Disposition: A | Discharge status: Home / Self Care | Payer: Self-pay | Source: Home / Self Care | Attending: Emergency Medicine

## 2024-01-31 DIAGNOSIS — R1011 Right upper quadrant pain: Secondary | ICD-10-CM

## 2024-01-31 HISTORY — PX: CHOLECYSTECTOMY: SHX55

## 2024-01-31 LAB — CBC
HCT: 37.9 % (ref 36.0–46.0)
Hemoglobin: 12.6 g/dL (ref 12.0–15.0)
MCH: 26.4 pg (ref 26.0–34.0)
MCHC: 33.2 g/dL (ref 30.0–36.0)
MCV: 79.5 fL — ABNORMAL LOW (ref 80.0–100.0)
Platelets: 249 K/uL (ref 150–400)
RBC: 4.77 MIL/uL (ref 3.87–5.11)
RDW: 13.9 % (ref 11.5–15.5)
WBC: 4.5 K/uL (ref 4.0–10.5)
nRBC: 0 % (ref 0.0–0.2)

## 2024-01-31 LAB — BASIC METABOLIC PANEL WITH GFR
Anion gap: 11 (ref 5–15)
BUN: 8 mg/dL (ref 6–20)
CO2: 26 mmol/L (ref 22–32)
Calcium: 8.8 mg/dL — ABNORMAL LOW (ref 8.9–10.3)
Chloride: 108 mmol/L (ref 98–111)
Creatinine, Ser: 0.68 mg/dL (ref 0.44–1.00)
GFR, Estimated: >60 mL/min
Glucose, Bld: 91 mg/dL (ref 70–99)
Potassium: 3.5 mmol/L (ref 3.5–5.1)
Sodium: 144 mmol/L (ref 135–145)

## 2024-01-31 LAB — SURGICAL PCR SCREEN
MRSA, PCR: NEGATIVE
Staphylococcus aureus: POSITIVE — AB

## 2024-01-31 MED ORDER — HYDROMORPHONE HCL 2 MG/ML IJ SOLN
INTRAMUSCULAR | Status: AC
  Filled 2024-01-31: qty 1

## 2024-01-31 MED ORDER — FENTANYL CITRATE (PF) 100 MCG/2ML IJ SOLN
INTRAMUSCULAR | Status: AC
  Filled 2024-01-31: qty 2

## 2024-01-31 MED ORDER — DEXAMETHASONE SOD PHOSPHATE PF 10 MG/ML IJ SOLN
INTRAMUSCULAR | Status: DC | PRN
  Administered 2024-01-31: 10 mg via INTRAVENOUS

## 2024-01-31 MED ORDER — ONDANSETRON HCL 4 MG/2ML IJ SOLN
INTRAMUSCULAR | Status: DC | PRN
  Administered 2024-01-31: 4 mg via INTRAVENOUS

## 2024-01-31 MED ORDER — LACTATED RINGERS IV SOLN: INTRAVENOUS | Status: DC | PRN

## 2024-01-31 MED ORDER — LACTATED RINGERS IR SOLN
Status: DC | PRN
  Administered 2024-01-31: 1000 mL

## 2024-01-31 MED ORDER — SUGAMMADEX SODIUM 200 MG/2ML IV SOLN
INTRAVENOUS | Status: AC
  Filled 2024-01-31: qty 2

## 2024-01-31 MED ORDER — PHENYLEPHRINE HCL (PRESSORS) 10 MG/ML IV SOLN
INTRAVENOUS | Status: DC | PRN
  Administered 2024-01-31: 80 ug via INTRAVENOUS

## 2024-01-31 MED ORDER — ACETAMINOPHEN 500 MG PO TABS: 1000.0000 mg | ORAL_TABLET | Freq: Three times a day (TID) | ORAL | Status: AC

## 2024-01-31 MED ORDER — STERILE WATER FOR IRRIGATION IR SOLN
Status: DC | PRN
  Administered 2024-01-31: 1000 mL

## 2024-01-31 MED ORDER — IOPAMIDOL (ISOVUE-300) INJECTION 61%
INTRAVENOUS | Status: DC | PRN
  Administered 2024-01-31: 7 mL

## 2024-01-31 MED ORDER — MUPIROCIN 2 % EX OINT
1.0000 | TOPICAL_OINTMENT | Freq: Two times a day (BID) | CUTANEOUS | Status: DC
  Administered 2024-01-31: 1 via NASAL
  Filled 2024-01-31: qty 22

## 2024-01-31 MED ORDER — LIDOCAINE HCL (PF) 2 % IJ SOLN
INTRAMUSCULAR | Status: DC | PRN
  Administered 2024-01-31: 60 mg via INTRADERMAL

## 2024-01-31 MED ORDER — BUPIVACAINE-EPINEPHRINE 0.25% -1:200000 IJ SOLN
INTRAMUSCULAR | Status: DC | PRN
  Administered 2024-01-31: 20 mL

## 2024-01-31 MED ORDER — TRAMADOL HCL 50 MG PO TABS
50.0000 mg | ORAL_TABLET | Freq: Four times a day (QID) | ORAL | 0 refills | Status: AC | PRN
  Filled 2024-01-31: qty 15, 4d supply, fill #0

## 2024-01-31 MED ORDER — CHLORHEXIDINE GLUCONATE CLOTH 2 % EX PADS
6.0000 | MEDICATED_PAD | Freq: Every day | CUTANEOUS | Status: DC
  Administered 2024-01-31: 6 via TOPICAL

## 2024-01-31 MED ORDER — HYDROMORPHONE HCL 1 MG/ML IJ SOLN
0.2500 mg | INTRAMUSCULAR | Status: DC | PRN
  Administered 2024-01-31 (×3): 0.5 mg via INTRAVENOUS

## 2024-01-31 MED ORDER — ENOXAPARIN SODIUM 40 MG/0.4ML IJ SOSY: 40.0000 mg | PREFILLED_SYRINGE | INTRAMUSCULAR | Status: DC

## 2024-01-31 MED ORDER — MIDAZOLAM HCL (PF) 2 MG/2ML IJ SOLN
INTRAMUSCULAR | Status: DC | PRN
  Administered 2024-01-31: 2 mg via INTRAVENOUS

## 2024-01-31 MED ORDER — ACETAMINOPHEN 500 MG PO TABS
1000.0000 mg | ORAL_TABLET | Freq: Four times a day (QID) | ORAL | Status: DC
  Administered 2024-01-31: 1000 mg via ORAL
  Filled 2024-01-31: qty 2

## 2024-01-31 MED ORDER — HYDROMORPHONE HCL 1 MG/ML IJ SOLN
INTRAMUSCULAR | Status: AC
  Filled 2024-01-31: qty 2

## 2024-01-31 MED ORDER — PROPOFOL 500 MG/50ML IV EMUL
INTRAVENOUS | Status: DC | PRN
  Administered 2024-01-31: 200 mg via INTRAVENOUS

## 2024-01-31 MED ORDER — FENTANYL CITRATE (PF) 100 MCG/2ML IJ SOLN
INTRAMUSCULAR | Status: DC | PRN
  Administered 2024-01-31 (×4): 50 ug via INTRAVENOUS

## 2024-01-31 MED ORDER — POTASSIUM CHLORIDE IN NACL 20-0.9 MEQ/L-% IV SOLN: INTRAVENOUS | Status: DC

## 2024-01-31 MED ORDER — ROCURONIUM BROMIDE 10 MG/ML (PF) SYRINGE
PREFILLED_SYRINGE | INTRAVENOUS | Status: AC
  Filled 2024-01-31: qty 10

## 2024-01-31 MED ORDER — ROCURONIUM BROMIDE 10 MG/ML (PF) SYRINGE
PREFILLED_SYRINGE | INTRAVENOUS | Status: DC | PRN
  Administered 2024-01-31: 50 mg via INTRAVENOUS
  Administered 2024-01-31: 20 mg via INTRAVENOUS

## 2024-01-31 MED ORDER — LIDOCAINE HCL (PF) 2 % IJ SOLN
INTRAMUSCULAR | Status: AC
  Filled 2024-01-31: qty 5

## 2024-01-31 MED ORDER — DIPHENHYDRAMINE HCL 50 MG/ML IJ SOLN: 12.5000 mg | Freq: Four times a day (QID) | INTRAMUSCULAR | Status: DC | PRN

## 2024-01-31 MED ORDER — BUPIVACAINE-EPINEPHRINE (PF) 0.25% -1:200000 IJ SOLN
INTRAMUSCULAR | Status: AC
  Filled 2024-01-31: qty 30

## 2024-01-31 MED ORDER — SIMETHICONE 80 MG PO CHEW: 40.0000 mg | CHEWABLE_TABLET | Freq: Four times a day (QID) | ORAL | Status: DC | PRN

## 2024-01-31 MED ORDER — SUGAMMADEX SODIUM 200 MG/2ML IV SOLN
INTRAVENOUS | Status: DC | PRN
  Administered 2024-01-31: 300 mg via INTRAVENOUS

## 2024-01-31 MED ORDER — MIDAZOLAM HCL 2 MG/2ML IJ SOLN
INTRAMUSCULAR | Status: AC
  Filled 2024-01-31: qty 2

## 2024-01-31 MED ORDER — CHLORHEXIDINE GLUCONATE 0.12 % MT SOLN
15.0000 mL | Freq: Once | OROMUCOSAL | Status: AC
  Administered 2024-01-31: 15 mL via OROMUCOSAL

## 2024-01-31 MED ORDER — ACETAMINOPHEN 500 MG PO TABS
1000.0000 mg | ORAL_TABLET | Freq: Once | ORAL | Status: AC
  Administered 2024-01-31: 1000 mg via ORAL
  Filled 2024-01-31: qty 2

## 2024-01-31 MED ORDER — HYDROMORPHONE HCL 1 MG/ML IJ SOLN
INTRAMUSCULAR | Status: DC | PRN
  Administered 2024-01-31 (×2): 1 mg via INTRAVENOUS

## 2024-01-31 MED ORDER — 0.9 % SODIUM CHLORIDE (POUR BTL) OPTIME
TOPICAL | Status: DC | PRN
  Administered 2024-01-31: 1000 mL

## 2024-01-31 MED ORDER — FENTANYL CITRATE (PF) 50 MCG/ML IJ SOSY: 25.0000 ug | PREFILLED_SYRINGE | INTRAMUSCULAR | Status: AC | PRN

## 2024-01-31 MED ORDER — PROPOFOL 10 MG/ML IV BOLUS
INTRAVENOUS | Status: AC
  Filled 2024-01-31: qty 20

## 2024-01-31 MED ORDER — DIPHENHYDRAMINE HCL 12.5 MG/5ML PO ELIX: 12.5000 mg | ORAL_SOLUTION | Freq: Four times a day (QID) | ORAL | Status: DC | PRN

## 2024-01-31 MED ORDER — ONDANSETRON HCL 4 MG/2ML IJ SOLN
INTRAMUSCULAR | Status: AC
  Filled 2024-01-31: qty 2

## 2024-01-31 MED ORDER — DEXAMETHASONE SOD PHOSPHATE PF 10 MG/ML IJ SOLN
INTRAMUSCULAR | Status: AC
  Filled 2024-01-31: qty 1

## 2024-01-31 MED ORDER — SPY AGENT GREEN - (INDOCYANINE FOR INJECTION)
1.2500 mg | Freq: Once | INTRAMUSCULAR | Status: AC
  Administered 2024-01-31: 1.25 mg via INTRAVENOUS

## 2024-01-31 MED ORDER — LACTATED RINGERS IV SOLN: INTRAVENOUS | Status: DC

## 2024-01-31 NOTE — Discharge Summary (Signed)
 "  Physician Discharge Summary  Meghan Orr FMW:[id number] DOB: [birth year] DOA: 01/30/2024  PCP: [facility]  Admit date: 01/30/2024 Discharge date: 01/31/2024  Admitted from: Home Discharge disposition: Home  Recommendations at discharge:  Cautious use of pain meds Follow-up with general surgery as an outpatient.  Subjective: Patient was seen and examined this afternoon. Sitting up in recliner.  Not in distress.  Says her pain is controlled.  Seems groggy from pain medicines given earlier.  Wants to go home tonight. Son at bedside.  Brief narrative: Meghan Orr is a 54 y.o. 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  She has been having intermittent RUQ pain for last several months, was particularly worse and hence presented to ED this time. Ultrasound right upper quadrant showed cholelithiasis, cholecystitis, CBD 3.8 mm Admitted to [facility] Seen by general surgery  Hospital course: Acute calculous cholecystitis S/p laparoscopic cholecystectomy with IOC -Dr. Tanda 01/31/2023 Presented with chronic intermittent abdominal pain  Ultrasound RUQ showed acute calculous cholecystitis  Seen by general surgery.  Underwent lap chole with IOC today Able to tolerate intake after that.  Pain controlled with pain medicines. Patient wants to go home tonight Okay to discharge home today with general surgery.    H/o gastric sleeve surgery GERD Continue PPI Avoid NSAIDs   Migraine Fioricet  every 6 hours as needed   Obesity 2 Body mass index is 37.59 kg/m. Patient has been advised to make an attempt to improve diet and exercise patterns to aid in weight loss.   fibromyalgia, anxiety, depression, PTSD PDMP reviewed, currently no active prescriptions   Goals of care    Code Status: Full Code   Diet:  Diet Order             Diet clear liquid Room service appropriate? Yes; Fluid consistency: Thin  Diet effective now           Diet general                   Nutritional status:  Body mass index is 37.59 kg/m.       Wounds:  - Wound 01/31/24 1400 Surgical Laparoscopic Umbilicus Medial;Upper Right;Lateral;Lower Right;Lateral;Upper (Active)  Date First Assessed/Time First Assessed: 01/31/24 1400   Present on Original Admission: No  Primary Wound Type: Surgical  Secondary Wound Type - Surgical: Laparoscopic  Number of Laparoscopic Ports: 4  Location Orientation: Umbilicus  Location Orienta...    Assessments 01/31/2024  2:17 PM 01/31/2024  4:15 PM  Port 1 Site Assessment -- Other (Comment)  Port 1 Margins Attached edges (approximated) Other (Comment)  Port 1 Drainage Amount -- None  Port 1 Dressing Type Adhesive strips;Gauze (Comment);Transparent dressing Gauze (Comment);Transparent dressing  Port 1 Dressing Status -- Clean, Dry, Intact  Port 2 Site Assessment -- Other (Comment)  Port 2 Margins Attached edges (approximated) Other (Comment)  Port 2 Drainage Amount -- None  Port 2 Dressing Type Gauze (Comment);Adhesive strips;Transparent dressing Gauze (Comment);Transparent dressing  Port 2 Dressing Status -- Clean, Dry, Intact  Port 3 Site Assessment -- Other (Comment)  Port 3 Margins Attached edges (approximated) Other (Comment)  Port 3 Drainage Amount -- None  Port 3 Dressing Type Gauze (Comment);Adhesive strips;Transparent dressing Gauze (Comment);Transparent dressing  Port 3 Dressing Status -- Clean, Dry, Intact  Port 4 Site Assessment -- Other (Comment)  Port 4  Margins Attached edges (approximated) Other (Comment)  Port 4 Drainage Amount -- None  Port 4 Dressing Type Gauze (Comment);Adhesive strips;Transparent dressing Gauze (Comment);Transparent dressing  Port 4 Dressing Status -- Clean, Dry, Intact     No associated orders.     Discharge Medications:   Allergies as of 01/31/2024       Reactions   Egg Protein-containing Drug Products Hives, Swelling   Facial Swelling   Sumatriptan  Anaphylaxis, Shortness Of Breath   Morphine Hives   Sulfa Antibiotics Nausea And Vomiting        Medication List     STOP taking these medications    diclofenac 75 MG EC tablet Commonly known as: VOLTAREN   famotidine  20 MG tablet Commonly known as: Pepcid        TAKE these medications    acetaminophen  500 MG tablet Commonly known as: TYLENOL  Take 2 tablets (1,000 mg total) by mouth every 8 (eight) hours.   cholecalciferol 25 MCG (1000 UNIT) tablet Commonly known as: VITAMIN D3 Take 1,000 Units by mouth daily.   clobetasol ointment 0.05 % Commonly known as: TEMOVATE Apply 1 Application topically 2 (two) times daily.   cyclobenzaprine  10 MG tablet Commonly known as: FLEXERIL  Take 10 mg by mouth 2 (two) times daily as needed.   dexlansoprazole  60 MG capsule Commonly known as: Dexilant  Take 1 capsule (60 mg total) by mouth daily.   diclofenac Sodium 1 % Gel Commonly known as: VOLTAREN Apply 2 g topically 3 (three) times daily as needed (back pain).   esomeprazole  40 MG capsule Commonly known as: NexIUM  Take 1 capsule (40 mg total) by mouth 2 (two) times daily before a meal.   fluticasone  50 MCG/ACT nasal spray Commonly known as: FLONASE  Place 1-2 sprays into both nostrils daily.   levonorgestrel 20 MCG/DAY Iud Commonly known as: MIRENA 1 each by Intrauterine route once.   lidocaine  5 % Commonly known as: LIDODERM  Place 1 patch onto the skin daily. What changed:  when to take this reasons to take this   magnesium  30 MG tablet Take 30 mg by mouth daily.   multivitamin with minerals Tabs tablet Take 1 tablet by mouth daily.   NURTEC PO Take 70 mg by mouth as needed.   nystatin powder Commonly known as: MYCOSTATIN/NYSTOP Apply 1 Application topically 3 (three) times daily.   nystatin  cream Commonly known as: MYCOSTATIN Apply 1 Application topically 2 (two) times daily.   promethazine  12.5 MG tablet Commonly known as: PHENERGAN  Take 12.5 mg by mouth every 6 (six) hours as needed for nausea or vomiting.   Topamax  200 MG tablet Generic drug: topiramate  Take 200 mg by mouth 2 (two) times daily. What changed: how much to take   traMADol  50 MG tablet Commonly known as: Ultram  Take 1 tablet (50 mg total) by mouth every 6 (six) hours as needed for up to 7 days.   traZODone  100 MG tablet Commonly known as: DESYREL  Take 100 mg by mouth at bedtime.         Follow ups:    Follow-up Information     Tanda Locus, MD. Schedule an appointment as soon as possible for a visit in 2 week(s).   Specialty: General Surgery Why: For wound re-check Contact information: [address] [place] [address] [phone]         [facility] Follow up.   Contact information: [address] [place] [address] [phone]  Discharge Instructions:   Discharge Instructions     Call MD for:  difficulty breathing, headache or visual disturbances   Complete by: As directed    Call MD for:  extreme fatigue   Complete by: As directed    Call MD for:  hives   Complete by: As directed    Call MD for:  persistant dizziness or light-headedness   Complete by: As directed    Call MD for:  persistant nausea and vomiting   Complete by: As directed    Call MD for:  severe uncontrolled pain   Complete by: As directed    Call MD for:  temperature >100.4   Complete by: As directed    Diet general   Complete by: As directed    Discharge instructions   Complete by: As directed    Recommendations at discharge:   Cautious use of pain meds  Follow-up with general surgery as an outpatient.  PDMP reviewed this encounter.   Opioid taper instructions: It is important to wean off of your opioid medication as soon as  possible. If you do not need pain medication after your surgery it is ok to stop day one. Opioids include: Codeine, Hydrocodone (Norco, Vicodin), Oxycodone (Percocet, oxycontin ) and hydromorphone  amongst others.  Long term and even short term use of opiods can cause: Increased pain response Dependence Constipation Depression Respiratory depression And more.  Withdrawal symptoms can include Flu like symptoms Nausea, vomiting And more Techniques to manage these symptoms Hydrate well Eat regular healthy meals Stay active Use relaxation techniques(deep breathing, meditating, yoga) Do Not substitute Alcohol to help with tapering If you have been on opioids for less than two weeks and do not have pain than it is ok to stop all together.  Plan to wean off of opioids This plan should start within one week post op of your joint replacement. Maintain the same interval or time between taking each dose and first decrease the dose.  Cut the total daily intake of opioids by one tablet each day Next start to increase the time between doses. The last dose that should be eliminated is the evening dose.        General discharge instructions: Follow with Primary MD [facility] in 7 days  Please request your PCP  to go over your hospital tests, procedures, radiology results at the follow up. Please get your medicines reviewed and adjusted.  Your PCP may decide to repeat certain labs or tests as needed. Do not drive, operate heavy machinery, perform activities at heights, swimming or participation in water  activities or provide baby sitting services if your were admitted for syncope or siezures until you have seen by Primary MD or a Neurologist and advised to do so again. [place]  Controlled Substance Reporting System database was reviewed. Do not drive, operate heavy machinery, perform activities at heights, swim, participate in water  activities or provide baby-sitting services  while on medications for pain, sleep and mood until your outpatient physician has reevaluated you and advised to do so again.  You are strongly recommended to comply with the dose, frequency and duration of prescribed medications. Activity: As tolerated with Full fall precautions use walker/cane & assistance as needed Avoid using any recreational substances like cigarette, tobacco, alcohol, or non-prescribed drug. If you experience worsening of your admission symptoms, develop shortness of breath, life threatening emergency, suicidal or homicidal thoughts you must seek medical attention immediately by calling 911 or calling your MD immediately  if symptoms  less severe. You must read complete instructions/literature along with all the possible adverse reactions/side effects for all the medicines you take and that have been prescribed to you. Take any new medicine only after you have completely understood and accepted all the possible adverse reactions/side effects.  Wear Seat belts while driving. You were cared for by a hospitalist during your hospital stay. If you have any questions about your discharge medications or the care you received while you were in the hospital after you are discharged, you can call the unit and ask to speak with the hospitalist or the covering physician. Once you are discharged, your primary care physician will handle any further medical issues. Please note that NO REFILLS for any discharge medications will be authorized once you are discharged, as it is imperative that you return to your primary care physician (or establish a relationship with a primary care physician if you do not have one).   Increase activity slowly   Complete by: As directed        Discharge Exam:   Vitals:   01/31/24 1550 01/31/24 1600 01/31/24 1615 01/31/24 1637  BP:  130/83 126/73 (!) 142/91  Pulse: 63 (!) 57 (!) 55 (!) 57  Resp: 16 13 11 16   Temp:    (!) 97.3 F (36.3 C)  TempSrc:    Oral   SpO2: 100% 94% 95% 90%  Weight:      Height:        Body mass index is 37.59 kg/m.  General exam: Pleasant, middle-aged African-American female Skin: No rashes, lesions or ulcers. HEENT: Atraumatic, normocephalic, no obvious bleeding Lungs: Clear to auscultation bilaterally,  CVS: S1, S2, no murmur,   GI/Abd: Soft, appropriate postop tenderness, nondistended, bowel sound present,   CNS: Somnolent from the effect of pain medicines.  Wakes up to answer questions.  Oriented x 3 Psychiatry: Mood appropriate Extremities: No pedal edema, no calf tenderness,    The results of significant diagnostics from this hospitalization (including imaging, microbiology, ancillary and laboratory) are listed below for reference.    Procedures and Diagnostic Studies:   DG Cholangiogram Operative Result Date: 01/31/2024 EXAM: INTRAOPERATIVE CHOLANGIOGRAM 01/31/2024 02:15:00 PM TECHNIQUE: Fluoroscopy provided by the radiology department. Fluoroscopy time 6 seconds. Radiologist was not present during procedure. Image(s) submitted for review. FLUOROSCOPY DOSE AND TYPE: Radiation Dose Index: Reference Air Kerma (in mGy) = 2.79 microgray COMPARISON: None available. CLINICAL HISTORY: 2Surgery, elective FINDINGS: 2 Intraoperative fluoroscopic views of the Thoracolumbar region. Catheter overlies the Right Upper Quadrant. Patient is status post cholecystectomy without leak. Contrast is seen filling the Common Bile Duct, Intrahepatic Biliary Ducts, and Duodenum. Contrast is also seen within Cystic Duct Remnant End. No filling defect to suggest choledocholithiasis. Normal contrast emptying into the small bowel. Please see dedicated operative report for further description. IMPRESSION: 1. Intraoperative cholangiogram following cholecystectomy. Electronically signed by: Greig Pique MD MD 01/31/2024 04:49 PM EST RP Workstation: [id number]   US  Abdomen Limited Result Date: 01/30/2024 CLINICAL DATA:  Right upper quadrant pain  with nausea. EXAM: ULTRASOUND ABDOMEN LIMITED RIGHT UPPER QUADRANT COMPARISON:  October 08, 2023 FINDINGS: Gallbladder: A 4.1 cm cluster of shadowing, echogenic gallstones is seen within the gallbladder lumen. There is no evidence of gallbladder wall thickening (1.5 mm). No sonographic Murphy sign noted by sonographer. Common bile duct: Diameter: 3.8 mm Liver: No focal lesion identified. Within normal limits in parenchymal echogenicity. Portal vein is patent on color Doppler imaging with normal direction of blood flow towards the liver. Other: None.  IMPRESSION: Cholelithiasis, without evidence of acute cholecystitis. Electronically Signed   By: Suzen Dials M.D.   On: 01/30/2024 13:04     Labs:   Basic Metabolic Panel: Recent Labs  Lab 01/30/24 1007 01/31/24 0521  NA 143 144  K 3.9 3.5  CL 104 108  CO2 28 26  GLUCOSE 92 91  BUN 11 8  CREATININE 0.60 0.68  CALCIUM 10.3 8.8*   GFR Estimated Creatinine Clearance: 105.7 mL/min (by C-G formula based on SCr of 0.68 mg/dL). Liver Function Tests: Recent Labs  Lab 01/30/24 1007  AST 26  ALT 17  ALKPHOS 83  BILITOT 0.6  PROT 7.8  ALBUMIN 4.4   Recent Labs  Lab 01/30/24 1007  LIPASE 40   No results for input(s): AMMONIA in the last 168 hours. Coagulation profile No results for input(s): INR, PROTIME in the last 168 hours.  CBC: Recent Labs  Lab 01/30/24 1007 01/31/24 0521  WBC 5.1 4.5  HGB 14.2 12.6  HCT 42.1 37.9  MCV 79.4* 79.5*  PLT 253 249   Cardiac Enzymes: No results for input(s): CKTOTAL, CKMB, CKMBINDEX, TROPONINI in the last 168 hours. BNP: Invalid input(s): POCBNP CBG: No results for input(s): GLUCAP in the last 168 hours. D-Dimer No results for input(s): DDIMER in the last 72 hours. Hgb A1c No results for input(s): HGBA1C in the last 72 hours. Lipid Profile No results for input(s): CHOL, HDL, LDLCALC, TRIG, CHOLHDL, LDLDIRECT in the last 72 hours. Thyroid  function studies No results for input(s): TSH, T4TOTAL, T3FREE, THYROIDAB in the last 72 hours.  Invalid input(s): FREET3 Anemia work up No results for input(s): VITAMINB12, FOLATE, FERRITIN, TIBC, IRON, RETICCTPCT in the last 72 hours. Microbiology Recent Results (from the past 240 hours)  Surgical pcr screen     Status: Abnormal   Collection Time: 01/31/24  8:32 AM   Specimen: Nasal Mucosa; Nasal Swab  Result Value Ref Range Status   MRSA, PCR NEGATIVE NEGATIVE Final   Staphylococcus aureus POSITIVE (A) NEGATIVE Final    Comment: (NOTE) The Xpert SA Assay (FDA approved for NASAL specimens in patients 64 years of age and older), is one component of a comprehensive surveillance program. It is not intended to diagnose infection nor to guide or monitor treatment. Performed at [facility], [address], [place], [place] [address]     Time coordinating discharge: 45 minutes  Signed: Dawnetta Copenhaver  [facility] 01/31/2024, 5:20 PM   "

## 2024-01-31 NOTE — Discharge Instructions (Signed)
 You were hospitalized for because your gallbladder was inflamed. We got your gallbladder taken out Thank you for allowing us  to be part of your care.   Your appointment is with Dr. Azadegan on 12/19 at 10:15 am. Please go to this appointment  Please note these changes made to your medications:  *Please START taking:  We are starting you on jardiance  10 mg. Please start taking this tomorrow(12/14) and take only one tablet a day   We are also giving you some medication that you can take over the next couple of days for pain. Please take this for severe pain. Make sure you are still having regular bowel movements with it.   Please call our clinic if you have any questions or concerns, we may be able to help and keep you from a long and expensive emergency room wait. Our clinic and after hours phone number is [phone], the best time to call is Monday through Friday 9 am to 4 pm but there is always someone available 24/7 if you have an emergency. If you need medication refills please notify your pharmacy one week in advance and they will send us  a request.      LAPAROSCOPIC SURGERY: POST OP INSTRUCTIONS Always review your discharge instruction sheet given to you by the facility where your surgery was performed. IF YOU HAVE DISABILITY OR FAMILY LEAVE FORMS, YOU MUST BRING THEM TO THE OFFICE FOR PROCESSING.   DO NOT GIVE THEM TO YOUR DOCTOR.  PAIN CONTROL  First take acetaminophen  (Tylenol ) AND/or ibuprofen (Advil) to control your pain after surgery.  Follow directions on package.  Taking acetaminophen  (Tylenol ) and/or ibuprofen (Advil) regularly after surgery will help to control your pain and lower the amount of prescription pain medication you may need.  You should not take more than 3,000 mg (3 grams) of acetaminophen  (Tylenol ) in 24 hours.  You should not take ibuprofen (Advil), aleve, motrin, naprosyn or other NSAIDS if you have a history of stomach ulcers or chronic kidney disease.  A  prescription for pain medication may be given to you upon discharge.  Take your pain medication as prescribed, if you still have uncontrolled pain after taking acetaminophen  (Tylenol ) or ibuprofen (Advil). Use ice packs to help control pain. If you need a refill on your pain medication, please contact your pharmacy.  They will contact our office to request authorization. Prescriptions will not be filled after 5pm or on week-ends.  HOME MEDICATIONS Take your usually prescribed medications unless otherwise directed.  DIET You should follow a light diet the first few days after arrival home.  Be sure to include lots of fluids daily. Avoid fatty, fried foods.   CONSTIPATION It is common to experience some constipation after surgery and if you are taking pain medication.  Increasing fluid intake and taking a stool softener (such as Colace) will usually help or prevent this problem from occurring.  A mild laxative (Milk of Magnesia or Miralax ) should be taken according to package instructions if there are no bowel movements after 48 hours.  WOUND/INCISION CARE Most patients will experience some swelling and bruising in the area of the incisions.  Ice packs will help.  Swelling and bruising can take several days to resolve.  Unless discharge instructions indicate otherwise, follow guidelines below  STERI-STRIPS - you may remove your outer bandages 48 hours after surgery, and you may shower at that time.  You have steri-strips (small skin tapes) in place directly over the incision.  These strips should  be left on the skin for 7-10 days.   DERMABOND/SKIN GLUE - you may shower in 24 hours.  The glue will flake off over the next 2-3 weeks. Any sutures or staples will be removed at the office during your follow-up visit.  ACTIVITIES You may resume regular (light) daily activities beginning the next day--such as daily self-care, walking, climbing stairs--gradually increasing activities as tolerated.  You may  have sexual intercourse when it is comfortable.  Refrain from any heavy lifting or straining until approved by your doctor. You may drive when you are no longer taking prescription pain medication, you can comfortably wear a seatbelt, and you can safely maneuver your car and apply brakes.  FOLLOW-UP You should see your doctor in the office for a follow-up appointment approximately 2-3 weeks after your surgery.  You should have been given your post-op/follow-up appointment when your surgery was scheduled.  If you did not receive a post-op/follow-up appointment, make sure that you call for this appointment within a day or two after you arrive home to insure a convenient appointment time.  OTHER INSTRUCTIONS  WHEN TO CALL YOUR DOCTOR: Fever over 101.0 Inability to urinate Continued bleeding from incision. Increased pain, redness, or drainage from the incision. Increasing abdominal pain  The clinic staff is available to answer your questions during regular business hours.  Please dont hesitate to call and ask to speak to one of the nurses for clinical concerns.  If you have a medical emergency, go to the nearest emergency room or call 911.  A surgeon from [facility] is always on call at the hospital. [address], [address], [place], [place]  [address] ? [address] [address], [place], [place]   [address] [phone] ? [phone] ? FAX [phone] Web site: [email or web address]

## 2024-01-31 NOTE — Progress Notes (Signed)
" °   01/31/24 1634  TOC Brief Assessment  Insurance and Status Reviewed  Patient has primary care physician Yes  Home environment has been reviewed resides in a private residence  Prior level of function: Independent  Prior/Current Home Services No current home services  Social Drivers of Health Review SDOH reviewed no interventions necessary  Readmission risk has been reviewed Yes  Transition of care needs no transition of care needs at this time    "

## 2024-01-31 NOTE — Transfer of Care (Signed)
 Immediate Anesthesia Transfer of Care Note  Patient: Meghan Orr  Procedure(s) Performed: Procedures with comments: LAPAROSCOPIC CHOLECYSTECTOMY WITH INTRAOPERATIVE CHOLANGIOGRAM (N/A) - ICG  Patient Location: PACU  Anesthesia Type:General  Level of Consciousness: Patient easily awoken, comfortable, cooperative, following commands, responds to stimulation.   Airway & Oxygen Therapy: Patient spontaneously breathing, ventilating well, oxygen via simple oxygen mask.  Post-op Assessment: Report given to PACU RN, vital signs reviewed and stable, moving all extremities.   Post vital signs: Reviewed and stable.  Complications: No apparent anesthesia complications Last Vitals:  Vitals Value Taken Time  BP 134/73 01/31/2024 1446  Temp    Pulse 74 01/31/24 14:46  Resp 17 01/31/24 14:46  SpO2 100 % 01/31/24 14:46  Vitals shown include unfiled device data.  Last Pain:  Vitals:   01/31/24 1220  TempSrc:   PainSc: 6       Patients Stated Pain Goal: 0 (01/30/24 1840)  Complications: No notable events documented.

## 2024-01-31 NOTE — Op Note (Signed)
 Lemon Whitacre [id number] [birth year] 01/31/2024  Laparoscopic Cholecystectomy with near infrared fluorescent cholangiography and intraoperative fluoroscopic cholangiogram procedure Note  Indications: This patient presents with symptomatic gallbladder disease and will undergo laparoscopic cholecystectomy.  Pre-operative Diagnosis: Symptomatic cholelithiasis  Post-operative Diagnosis: Early acute calculous cholecystitis  Surgeon: [name] MD FACS  Assistants: Adina Lima MD FACS (an assistant was required due to the patient's obesity and prior abdominal surgeries to help with retraction of tissue)  Anesthesia: General endotracheal anesthesia   Procedure Details  The patient was seen again in the Holding Room. The risks, benefits, complications, treatment options, and expected outcomes were discussed with the patient. The possibilities of reaction to medication, pulmonary aspiration, perforation of viscus, bleeding, recurrent infection, finding a normal gallbladder, the need for additional procedures, failure to diagnose a condition, the possible need to convert to an open procedure, and creating a complication requiring transfusion or operation were discussed with the patient. The likelihood of improving the patient's symptoms with return to their baseline status is good.  The patient and/or family concurred with the proposed plan, giving informed consent. The site of surgery properly noted. The patient was taken to Operating Room, identified as Damien Charlies Moats and the procedure verified as Laparoscopic Cholecystectomy with Intraoperative Cholangiogram. A Time Out was held and the above information confirmed. Antibiotic prophylaxis was administered. The patient was administered green dye preop.   Prior to the induction of general anesthesia, antibiotic prophylaxis was administered. General endotracheal anesthesia was then administered and tolerated well. After the induction, the abdomen was  prepped with Chloraprep and draped in the sterile fashion. The patient was positioned in the supine position.  Local anesthetic agent was injected into the skin near the umbilicus and an incision made. We dissected down to the abdominal fascia with blunt dissection.  The fascia was incised vertically and we entered the peritoneal cavity bluntly.  A pursestring suture of 0-Vicryl was placed around the fascial opening.  The Hasson cannula was inserted and secured with the stay suture.  Pneumoperitoneum was then created with CO2 and tolerated well without any adverse changes in the patient's vital signs. An 5-mm port was placed in the subxiphoid position.  Two 5-mm ports were placed in the right upper quadrant. All skin incisions were infiltrated with a local anesthetic agent before making the incision and placing the trocars.  No adhesions to the anterior abdominal wall.  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  We positioned the patient in reverse Trendelenburg, tilted slightly to the patient's left.  The gallbladder was identified, the fundus grasped and retracted cephalad. Adhesions were lysed bluntly and with the electrocautery where indicated, taking care not to injure any adjacent organs or viscus. The infundibulum was grasped and retracted laterally, exposing the peritoneum overlying the triangle of Calot. This was then divided and exposed in a blunt fashion. A critical view of the cystic duct and cystic artery was obtained.  The cystic duct was clearly identified and bluntly dissected circumferentially.  There was some edema in the gallbladder wall.  Utilizing the Stryker camera system near infrared fluorescent activity was visualized in the liver, cystic duct, common hepatic duct and common bile duct.  This served as a secondary confirmation of our anatomy.   The cystic duct was ligated with a clip distally.   An incision was made in the cystic duct and the [facility] cholangiogram catheter  introduced. The catheter was secured using a clip. A cholangiogram was then obtained which showed good visualization  of the distal and proximal biliary tree with no sign of filling defects or obstruction.  Contrast flowed easily into the duodenum. The catheter was then removed.   The cystic duct was then ligated with clips and divided. The cystic artery which had been identified & dissected free was ligated with clips and divided as well.   The gallbladder was dissected from the liver bed in retrograde fashion with the electrocautery.  There was a small blood vessel in the gallbladder fossa going into the gallbladder out of the liver bed which was clipped as well the gallbladder was removed and placed in an Endo Catch bag.  The gallbladder and Endo Catch bag were then removed through the umbilical port site. The liver bed was irrigated and inspected. Hemostasis was achieved with the electrocautery. Copious irrigation was utilized and was repeatedly aspirated until clear.  The pursestring suture was used to close the umbilical fascia.  An additional interrupted 0 Vicryl was placed at the umbilical fascia with PMI suture passer with laparoscopic guidance  We again inspected the right upper quadrant for hemostasis.  The umbilical closure was inspected and there was no air leak and nothing trapped within the closure. Pneumoperitoneum was released as we removed the trocars.  4-0 Monocryl was used to close the skin.    steri-strips, and clean dressings were applied. The patient was then extubated and brought to the recovery room in stable condition. Instrument, sponge, and needle counts were correct at closure and at the conclusion of the case.   Findings: Cholecystitis with Cholelithiasis critical view Near infrared fluorescent cholangiography visualized activity in the liver, common hepatic duct, common bile duct, cystic duct and small bowel Normal intraoperative cholangiogram Estimated Blood Loss:  Minimal         Drains: none         Specimens: Gallbladder           Complications: None; patient tolerated the procedure well.         Disposition: PACU - hemodynamically stable.         Condition: stable  Camellia HERO. Tanda, MD, FACS General, Bariatric, & Minimally Invasive Surgery [facility],  [facility]

## 2024-01-31 NOTE — Progress Notes (Signed)
 Pt has been discharged with female as her driver. She remained warm, dry, no visible distress. No reports of pain. Pt was transported to car via wheelchair.

## 2024-01-31 NOTE — Progress Notes (Signed)
 Pt has tolerated turkey sandwich without any signs or symptoms of nausea and or vomiting. Surgical sites to abdomen is intact and approximated. She is alert and oriented x4; ambulatory independently. Reviewed discharge instructions with the pt. She shared that she does have the discharge pain medication that has been prescribed. Pt voiced understanding and is awaiting her family to arrive to pick her up.

## 2024-01-31 NOTE — Anesthesia Postprocedure Evaluation (Signed)
"   Anesthesia Post Note  Patient: Meghan Orr  Procedure(s) Performed: LAPAROSCOPIC CHOLECYSTECTOMY WITH INTRAOPERATIVE CHOLANGIOGRAM     Patient location during evaluation: PACU Anesthesia Type: General Level of consciousness: awake and alert Pain management: pain level controlled Vital Signs Assessment: post-procedure vital signs reviewed and stable Respiratory status: spontaneous breathing, nonlabored ventilation and respiratory function stable Cardiovascular status: blood pressure returned to baseline and stable Postop Assessment: no apparent nausea or vomiting Anesthetic complications: no   No notable events documented.  Last Vitals:  Vitals:   01/31/24 1519 01/31/24 1530  BP:  136/87  Pulse: 63 (!) 55  Resp: 17 14  Temp:    SpO2: 100% 97%    Last Pain:  Vitals:   01/31/24 1530  TempSrc:   PainSc: Asleep                 Chasiti Waddington,W. EDMOND      "

## 2024-01-31 NOTE — Anesthesia Preprocedure Evaluation (Addendum)
"                                    Anesthesia Evaluation  Patient identified by MRN, date of birth, ID band Patient awake    Reviewed: Allergy & Precautions, H&P , NPO status , Patient's Chart, lab work & pertinent test results  Airway Mallampati: II  TM Distance: >3 FB Neck ROM: Full    Dental no notable dental hx. (+) Teeth Intact, Dental Advisory Given   Pulmonary former smoker   Pulmonary exam normal breath sounds clear to auscultation       Cardiovascular negative cardio ROS  Rhythm:Regular Rate:Normal     Neuro/Psych  Headaches  Anxiety Depression       GI/Hepatic Neg liver ROS,GERD  Medicated,,  Endo/Other    Class 3 obesity  Renal/GU negative Renal ROS  negative genitourinary   Musculoskeletal  (+) Arthritis , Osteoarthritis,    Abdominal   Peds  Hematology negative hematology ROS (+)   Anesthesia Other Findings   Reproductive/Obstetrics negative OB ROS                              Anesthesia Physical Anesthesia Plan  ASA: 3  Anesthesia Plan: General   Post-op Pain Management: Tylenol  PO (pre-op)*   Induction: Intravenous  PONV Risk Score and Plan: 4 or greater and Ondansetron , Dexamethasone  and Midazolam   Airway Management Planned: Oral ETT  Additional Equipment:   Intra-op Plan:   Post-operative Plan: Extubation in OR  Informed Consent: I have reviewed the patients History and Physical, chart, labs and discussed the procedure including the risks, benefits and alternatives for the proposed anesthesia with the patient or authorized representative who has indicated his/her understanding and acceptance.     Dental advisory given  Plan Discussed with: CRNA  Anesthesia Plan Comments:          Anesthesia Quick Evaluation  "

## 2024-01-31 NOTE — Plan of Care (Signed)
   Problem: Clinical Measurements: Goal: Diagnostic test results will improve Outcome: Progressing

## 2024-01-31 NOTE — Interval H&P Note (Signed)
 History and Physical Interval Note:  01/31/2024 12:58 PM  Meghan Orr  has presented today for surgery, with the diagnosis of GALLSTONES.  The various methods of treatment have been discussed with the patient and family. After consideration of risks, benefits and other options for treatment, the patient has consented to  Procedures with comments: LAPAROSCOPIC CHOLECYSTECTOMY WITH INTRAOPERATIVE CHOLANGIOGRAM (N/A) - ICG as a surgical intervention.  The patient's history has been reviewed, patient examined, no change in status, stable for surgery.  I have reviewed the patient's chart and labs.  Questions were answered to the patient's satisfaction.    I believe the patient's symptoms are consistent with gallbladder disease.  We discussed gallbladder disease.  I discussed laparoscopic cholecystectomy with possible IOC in detail.  The patient was shown diagrams detailing the procedure.  We discussed the risks and benefits of a laparoscopic cholecystectomy including, but not limited to bleeding, infection, injury to surrounding structures such as the intestine or liver, bile leak, retained gallstones, need to convert to an open procedure, prolonged diarrhea, blood clots such as  DVT, common bile duct injury, anesthesia risks, and possible need for additional procedures.  We discussed the typical post-operative recovery course. I explained that the likelihood of improvement of their symptoms is good.  [name]

## 2024-01-31 NOTE — Anesthesia Procedure Notes (Signed)
 Procedure Name: Intubation Date/Time: 01/31/2024 1:21 PM  Performed by: Joshua Vernell BROCKS, CRNAPre-anesthesia Checklist: Patient identified, Emergency Drugs available, Suction available and Patient being monitored Patient Re-evaluated:Patient Re-evaluated prior to induction Oxygen Delivery Method: Circle system utilized Preoxygenation: Pre-oxygenation with 100% oxygen Induction Type: IV induction Ventilation: Mask ventilation without difficulty Laryngoscope Size: Mac and 4 Grade View: Grade I Tube type: Oral Tube size: 7.0 mm Number of attempts: 1 Airway Equipment and Method: Stylet Placement Confirmation: ETT inserted through vocal cords under direct vision, positive ETCO2 and breath sounds checked- equal and bilateral Secured at: 21 cm Tube secured with: Tape Dental Injury: Teeth and Oropharynx as per pre-operative assessment

## 2024-02-01 LAB — SURGICAL PATHOLOGY

## 2024-02-02 ENCOUNTER — Encounter (HOSPITAL_COMMUNITY): Payer: Self-pay | Admitting: General Surgery

## 2024-02-08 ENCOUNTER — Ambulatory Visit: Payer: Self-pay | Admitting: General Surgery

## 2024-02-08 DIAGNOSIS — K2100 Gastro-esophageal reflux disease with esophagitis, without bleeding: Secondary | ICD-10-CM

## 2024-02-08 DIAGNOSIS — Z9884 Bariatric surgery status: Secondary | ICD-10-CM

## 2024-02-09 ENCOUNTER — Encounter (HOSPITAL_COMMUNITY)
Admission: RE | Admit: 2024-02-09 | Discharge: 2024-02-09 | Disposition: A | Discharge status: Home / Self Care | Source: Ambulatory Visit | Attending: Anesthesiology | Admitting: Anesthesiology

## 2024-02-09 ENCOUNTER — Other Ambulatory Visit: Payer: Self-pay

## 2024-02-09 ENCOUNTER — Encounter (HOSPITAL_COMMUNITY): Payer: Self-pay

## 2024-02-09 DIAGNOSIS — Z01818 Encounter for other preprocedural examination: Secondary | ICD-10-CM

## 2024-02-09 NOTE — Progress Notes (Signed)
 Anesthesia Review:  PCP: Cardiologist :  PPM/ ICD: Device Orders: Rep Notified:  Chest x-ray : EKG : Echo : Stress test: Cardiac Cath :   Activity level:  Sleep Study/ CPAP : Fasting Blood Sugar :      / Checks Blood Sugar -- times a day:    Blood Thinner/ Instructions /Last Dose: ASA / Instructions/ Last Dose :    01/31/2024- lap chole  01/30/2024 In ED    Preop appt completed via phone call on 02/09/2024.  Med hx and preop instructons completed.  PT to come in on 02/12/2024 for labs and to pick up instsructions with soap and drink    PT had lab appt on 02/12/2024.  Called pt at 0930 when did not show for 0900am appt.  PT states she is going to talk to MD on 02/13/24 about surgery and risks before she proceeds.  If pt decedie to proceed bag is in lab in cabinet and she will need lab appt.  [name] and charge nurse aware.

## 2024-02-09 NOTE — Patient Instructions (Addendum)
 SURGICAL WAITING ROOM VISITATION  Patients having surgery or a procedure may have no more than 2 support people in the waiting area - these visitors may rotate.    Children ages 1 and under will not be able to visit patients in [facility] under most circumstances.   Visitors with respiratory illnesses are discouraged from visiting and should remain at home.  If the patient needs to stay at the hospital during part of their recovery, the visitor guidelines for inpatient rooms apply. Pre-op nurse will coordinate an appropriate time for 1 support person to accompany patient in pre-op.  This support person may not rotate.    Please refer to the [facility] website for the visitor guidelines for Inpatients (after your surgery is over and you are in a regular room).       Your procedure is scheduled on: 02/20/2024   Report to [facility] Main Entrance    Report to admitting at     [phone]   Call this number if you have problems the morning of surgery [phone]   Do not eat food :After Midnight.   After Midnight you may have the following liquids until __0430____ AM/ PM DAY OF SURGERY  Water  Non-Citrus Juices (without pulp, NO RED-Apple, White grape, White cranberry) Black Coffee (NO MILK/CREAM OR CREAMERS, sugar ok)  Clear Tea (NO MILK/CREAM OR CREAMERS, sugar ok) regular and decaf                             Plain Jell-O (NO RED)                                           Fruit ices (not with fruit pulp, NO RED)                                     Popsicles (NO RED)                                                               Sports drinks like Gatorade (NO RED)       The day of surgery:  Drink ONE (1) Pre-Surgery Clear Ensure or G2 at  0430 AM the morning of surgery. Drink in one sitting. Do not sip.  This drink was given to you during your hospital  pre-op appointment visit. Nothing else to drink after completing the  Pre-Surgery Clear Ensure or G2.           If you have questions, please contact your surgeons office.      Oral Hygiene is also important to reduce your risk of infection.                                    Remember - BRUSH YOUR TEETH THE MORNING OF SURGERY WITH YOUR REGULAR TOOTHPASTE  DENTURES WILL BE REMOVED PRIOR TO SURGERY PLEASE DO NOT APPLY Poly grip OR ADHESIVES!!!   Do NOT smoke after Midnight   Stop  all vitamins and herbal supplements 7 days before surgery.   Take these medicines the morning of surgery with A SIP OF WATER : none  DO NOT TAKE ANY ORAL DIABETIC MEDICATIONS DAY OF YOUR SURGERY  Bring CPAP mask and tubing day of surgery.                              You may not have any metal on your body including hair pins, jewelry, and body piercing             Do not wear make-up, lotions, powders, perfumes/cologne, or deodorant  Do not wear nail polish including gel and S&S, artificial/acrylic nails, or any other type of covering on natural nails including finger and toenails. If you have artificial nails, gel coating, etc. that needs to be removed by a nail salon please have this removed prior to surgery or surgery may need to be canceled/ delayed if the surgeon/ anesthesia feels like they are unable to be safely monitored.   Do not shave  48 hours prior to surgery.               Men may shave face and neck.   Do not bring valuables to the hospital. [facility] IS NOT             RESPONSIBLE   FOR VALUABLES.   Contacts, glasses, dentures or bridgework may not be worn into surgery.   Bring small overnight bag day of surgery.   DO NOT BRING YOUR HOME MEDICATIONS TO THE HOSPITAL. PHARMACY WILL DISPENSE MEDICATIONS LISTED ON YOUR MEDICATION LIST TO YOU DURING YOUR ADMISSION IN THE HOSPITAL!    Patients discharged on the day of surgery will not be allowed to drive home.  Someone NEEDS to stay with you for the first 24 hours after anesthesia.   Special Instructions: Bring a copy of your healthcare power  of attorney and living will documents the day of surgery if you haven't scanned them before.              Please read over the following fact sheets you were given: IF YOU HAVE QUESTIONS ABOUT YOUR PRE-OP INSTRUCTIONS PLEASE CALL [phone].   If you received a COVID test during your pre-op visit  it is requested that you wear a mask when out in public, stay away from anyone that may not be feeling well and notify your surgeon if you develop symptoms. If you test positive for Covid or have been in contact with anyone that has tested positive in the last 10 days please notify you surgeon.    [place] - Preparing for Surgery Before surgery, you can play an important role.  Because skin is not sterile, your skin needs to be as free of germs as possible.  You can reduce the number of germs on your skin by washing with CHG (chlorahexidine gluconate) soap before surgery.  CHG is an antiseptic cleaner which kills germs and bonds with the skin to continue killing germs even after washing. Please DO NOT use if you have an allergy to CHG or antibacterial soaps.  If your skin becomes reddened/irritated stop using the CHG and inform your nurse when you arrive at Short Stay. Do not shave (including legs and underarms) for at least 48 hours prior to the first CHG shower.  You may shave your face/neck.  Please follow these instructions carefully:  1.  Shower with CHG Soap  the night before surgery ONLY (DO NOT USE THE SOAP THE MORNING OF SURGERY).  2.  If you choose to wash your hair, wash your hair first as usual with your normal  shampoo.  3.  After you shampoo, rinse your hair and body thoroughly to remove the shampoo.                             4.  Use CHG as you would any other liquid soap.  You can apply chg directly to the skin and wash.  Gently with a scrungie or clean washcloth.  5.  Apply the CHG Soap to your body ONLY FROM THE NECK DOWN.   Do   not use on face/ open                           Wound or  open sores. Avoid contact with eyes, ears mouth and   genitals (private parts).                       Wash face,  Genitals (private parts) with your normal soap.             6.  Wash thoroughly, paying special attention to the area where your    surgery  will be performed.  7.  Thoroughly rinse your body with warm water  from the neck down.  8.  DO NOT shower/wash with your normal soap after using and rinsing off the CHG Soap.                9.  Pat yourself dry with a clean towel.            10.  Wear clean pajamas.            11.  Place clean sheets on your bed the night of your first shower and do not  sleep with pets. Day of Surgery : Do not apply any CHG, lotions/deodorants the morning of surgery.  Please wear clean clothes to the hospital/surgery center.  FAILURE TO FOLLOW THESE INSTRUCTIONS MAY RESULT IN THE CANCELLATION OF YOUR SURGERY  PATIENT SIGNATURE_________________________________  NURSE SIGNATURE__________________________________  ________________________________________________________________________

## 2024-02-12 ENCOUNTER — Encounter (HOSPITAL_COMMUNITY): Admission: RE | Admit: 2024-02-12 | Source: Ambulatory Visit

## 2024-02-12 NOTE — Discharge Instructions (Signed)

## 2024-02-20 ENCOUNTER — Encounter (HOSPITAL_COMMUNITY): Admission: RE | Payer: Self-pay | Source: Home / Self Care

## 2024-02-20 ENCOUNTER — Inpatient Hospital Stay (HOSPITAL_COMMUNITY): Admit: 2024-02-20 | Admitting: General Surgery

## 2024-02-20 SURGERY — CREATION, GASTRIC BYPASS, LAPAROSCOPIC WITH ROUX-EN-Y GASTROENTEROSTOMY
Anesthesia: General

## 2024-03-05 ENCOUNTER — Encounter

## 2024-03-20 ENCOUNTER — Ambulatory Visit: Admitting: Internal Medicine
